# Patient Record
Sex: Female | Born: 1960 | Race: White | Hispanic: No | Marital: Married | Smoking: Former smoker
Health system: Southern US, Community
[De-identification: ages and names within clinical notes are randomized; demographics above are authoritative.]

## PROBLEM LIST (undated history)

## (undated) DIAGNOSIS — F329 Major depressive disorder, single episode, unspecified: Secondary | ICD-10-CM

## (undated) DIAGNOSIS — F419 Anxiety disorder, unspecified: Secondary | ICD-10-CM

## (undated) DIAGNOSIS — M810 Age-related osteoporosis without current pathological fracture: Secondary | ICD-10-CM

## (undated) DIAGNOSIS — E785 Hyperlipidemia, unspecified: Secondary | ICD-10-CM

## (undated) DIAGNOSIS — F32A Depression, unspecified: Secondary | ICD-10-CM

## (undated) DIAGNOSIS — I10 Essential (primary) hypertension: Secondary | ICD-10-CM

## (undated) DIAGNOSIS — K219 Gastro-esophageal reflux disease without esophagitis: Secondary | ICD-10-CM

## (undated) HISTORY — DX: Age-related osteoporosis without current pathological fracture: M81.0

## (undated) HISTORY — DX: Essential (primary) hypertension: I10

## (undated) HISTORY — DX: Major depressive disorder, single episode, unspecified: F32.9

## (undated) HISTORY — DX: Anxiety disorder, unspecified: F41.9

## (undated) HISTORY — DX: Hyperlipidemia, unspecified: E78.5

## (undated) HISTORY — DX: Depression, unspecified: F32.A

## (undated) HISTORY — DX: Gastro-esophageal reflux disease without esophagitis: K21.9

## (undated) HISTORY — PX: CHOLECYSTECTOMY: SHX55

---

## 2014-10-19 ENCOUNTER — Encounter: Payer: Self-pay | Admitting: *Deleted

## 2014-11-15 ENCOUNTER — Ambulatory Visit (INDEPENDENT_AMBULATORY_CARE_PROVIDER_SITE_OTHER): Payer: Medicare Other | Admitting: Physician Assistant

## 2014-11-15 ENCOUNTER — Encounter: Payer: Self-pay | Admitting: Physician Assistant

## 2014-11-15 VITALS — BP 114/66 | HR 76 | Temp 97.8°F | Resp 18 | Ht 60.5 in | Wt 180.0 lb

## 2014-11-15 DIAGNOSIS — M810 Age-related osteoporosis without current pathological fracture: Secondary | ICD-10-CM | POA: Insufficient documentation

## 2014-11-15 DIAGNOSIS — R32 Unspecified urinary incontinence: Secondary | ICD-10-CM | POA: Diagnosis not present

## 2014-11-15 DIAGNOSIS — F4310 Post-traumatic stress disorder, unspecified: Secondary | ICD-10-CM | POA: Insufficient documentation

## 2014-11-15 DIAGNOSIS — I10 Essential (primary) hypertension: Secondary | ICD-10-CM | POA: Diagnosis not present

## 2014-11-15 DIAGNOSIS — G43809 Other migraine, not intractable, without status migrainosus: Secondary | ICD-10-CM

## 2014-11-15 DIAGNOSIS — G43909 Migraine, unspecified, not intractable, without status migrainosus: Secondary | ICD-10-CM | POA: Insufficient documentation

## 2014-11-15 DIAGNOSIS — F411 Generalized anxiety disorder: Secondary | ICD-10-CM | POA: Diagnosis not present

## 2014-11-15 DIAGNOSIS — Z23 Encounter for immunization: Secondary | ICD-10-CM

## 2014-11-15 DIAGNOSIS — F329 Major depressive disorder, single episode, unspecified: Secondary | ICD-10-CM | POA: Diagnosis not present

## 2014-11-15 DIAGNOSIS — E785 Hyperlipidemia, unspecified: Secondary | ICD-10-CM | POA: Insufficient documentation

## 2014-11-15 DIAGNOSIS — K759 Inflammatory liver disease, unspecified: Secondary | ICD-10-CM | POA: Insufficient documentation

## 2014-11-15 DIAGNOSIS — F32A Depression, unspecified: Secondary | ICD-10-CM | POA: Insufficient documentation

## 2014-11-15 LAB — COMPLETE METABOLIC PANEL WITH GFR
ALT: 38 U/L — AB (ref 6–29)
AST: 24 U/L (ref 10–35)
Albumin: 4.2 g/dL (ref 3.6–5.1)
Alkaline Phosphatase: 156 U/L — ABNORMAL HIGH (ref 33–130)
BILIRUBIN TOTAL: 0.6 mg/dL (ref 0.2–1.2)
BUN: 11 mg/dL (ref 7–25)
CO2: 27 mmol/L (ref 20–31)
CREATININE: 0.6 mg/dL (ref 0.50–1.05)
Calcium: 9.6 mg/dL (ref 8.6–10.4)
Chloride: 106 mmol/L (ref 98–110)
GFR, Est African American: >89 mL/min (ref >=60)
GFR, Est Non African American: >89 mL/min (ref >=60)
GLUCOSE: 96 mg/dL (ref 70–99)
Potassium: 4.4 mmol/L (ref 3.5–5.3)
SODIUM: 138 mmol/L (ref 135–146)
TOTAL PROTEIN: 6.7 g/dL (ref 6.1–8.1)

## 2014-11-15 LAB — LIPID PANEL
Cholesterol: 235 mg/dL — ABNORMAL HIGH (ref 125–200)
HDL: 27 mg/dL — AB (ref >=46)
LDL CALC: 156 mg/dL — AB (ref <130)
Total CHOL/HDL Ratio: 8.7 Ratio — ABNORMAL HIGH (ref <=5.0)
Triglycerides: 259 mg/dL — ABNORMAL HIGH (ref <150)
VLDL: 52 mg/dL — ABNORMAL HIGH (ref <30)

## 2014-11-15 MED ORDER — SUMATRIPTAN SUCCINATE 100 MG PO TABS: 100.0000 mg | ORAL_TABLET | ORAL | Status: DC | PRN

## 2014-11-15 MED ORDER — CLONAZEPAM 1 MG PO TABS: 1.0000 mg | ORAL_TABLET | Freq: Two times a day (BID) | ORAL | Status: DC | PRN

## 2014-11-15 MED ORDER — CLONIDINE HCL 0.1 MG PO TABS: 0.1000 mg | ORAL_TABLET | Freq: Two times a day (BID) | ORAL | Status: DC
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
## 2014-11-16 ENCOUNTER — Other Ambulatory Visit: Payer: Self-pay | Admitting: Family Medicine

## 2014-11-16 MED ORDER — ATORVASTATIN CALCIUM 10 MG PO TABS: 10.0000 mg | ORAL_TABLET | Freq: Every day | ORAL | Status: DC

## 2014-11-16 MED ORDER — ALENDRONATE SODIUM 70 MG PO TABS: 70.0000 mg | ORAL_TABLET | ORAL | Status: DC

## 2014-11-16 MED ORDER — SOLIFENACIN SUCCINATE 5 MG PO TABS: 5.0000 mg | ORAL_TABLET | Freq: Every day | ORAL | Status: DC

## 2014-11-16 MED ORDER — OMEPRAZOLE 20 MG PO CPDR: 20.0000 mg | DELAYED_RELEASE_CAPSULE | Freq: Every day | ORAL | Status: DC

## 2014-11-16 MED ORDER — CITALOPRAM HYDROBROMIDE 40 MG PO TABS: 40.0000 mg | ORAL_TABLET | Freq: Every day | ORAL | Status: DC

## 2014-11-16 NOTE — Telephone Encounter (Signed)
Medication refilled per protocol. 

## 2014-12-07 ENCOUNTER — Encounter: Payer: Self-pay | Admitting: Physician Assistant

## 2014-12-07 ENCOUNTER — Ambulatory Visit (INDEPENDENT_AMBULATORY_CARE_PROVIDER_SITE_OTHER): Payer: Medicare Other | Admitting: Physician Assistant

## 2014-12-07 VITALS — BP 126/84 | HR 88 | Temp 98.6°F | Resp 18 | Wt 181.0 lb

## 2014-12-07 DIAGNOSIS — G43809 Other migraine, not intractable, without status migrainosus: Secondary | ICD-10-CM | POA: Diagnosis not present

## 2014-12-07 DIAGNOSIS — F329 Major depressive disorder, single episode, unspecified: Secondary | ICD-10-CM

## 2014-12-07 DIAGNOSIS — M810 Age-related osteoporosis without current pathological fracture: Secondary | ICD-10-CM | POA: Diagnosis not present

## 2014-12-07 DIAGNOSIS — R32 Unspecified urinary incontinence: Secondary | ICD-10-CM | POA: Diagnosis not present

## 2014-12-07 DIAGNOSIS — E785 Hyperlipidemia, unspecified: Secondary | ICD-10-CM

## 2014-12-07 DIAGNOSIS — K759 Inflammatory liver disease, unspecified: Secondary | ICD-10-CM

## 2014-12-07 DIAGNOSIS — F411 Generalized anxiety disorder: Secondary | ICD-10-CM | POA: Diagnosis not present

## 2014-12-07 DIAGNOSIS — F4310 Post-traumatic stress disorder, unspecified: Secondary | ICD-10-CM | POA: Diagnosis not present

## 2014-12-07 DIAGNOSIS — F32A Depression, unspecified: Secondary | ICD-10-CM

## 2014-12-07 DIAGNOSIS — I10 Essential (primary) hypertension: Secondary | ICD-10-CM | POA: Diagnosis not present
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
## 2014-12-19 ENCOUNTER — Other Ambulatory Visit: Payer: Self-pay | Admitting: Physician Assistant

## 2014-12-19 NOTE — Telephone Encounter (Signed)
Ok to refill??  Last office visit 12/07/2014.  Last refill 11/15/2014.

## 2014-12-20 NOTE — Telephone Encounter (Signed)
#  60+1 refill. Tell patient that this must last 2 full months. Will not fill early.

## 2014-12-20 NOTE — Telephone Encounter (Signed)
Medication refilled per protocol. 

## 2015-02-19 ENCOUNTER — Other Ambulatory Visit: Payer: Self-pay | Admitting: Physician Assistant

## 2015-02-20 NOTE — Telephone Encounter (Signed)
LRF 12/20/14 #60 + 1 (clonazepam)  LOV 12/07/14  OK refill?

## 2015-02-20 NOTE — Telephone Encounter (Signed)
Medication refilled per protocol. 

## 2015-02-20 NOTE — Telephone Encounter (Signed)
Approved for # 60 + 2--but make sure pt aware each must last full 30 days

## 2015-03-21 ENCOUNTER — Other Ambulatory Visit: Payer: Self-pay | Admitting: Physician Assistant

## 2015-03-21 NOTE — Telephone Encounter (Signed)
Medication refilled per protocol. 

## 2015-05-27 ENCOUNTER — Other Ambulatory Visit: Payer: Self-pay | Admitting: Physician Assistant

## 2015-05-28 NOTE — Telephone Encounter (Signed)
LRF Clonazepam 02/20/15 #60 + 2  LRF Imitrex 02/20/15 #10 + 2  LOV 12/07/14  Has appt next week  OK refill?

## 2015-05-28 NOTE — Telephone Encounter (Signed)
Refills called in 

## 2015-05-28 NOTE — Telephone Encounter (Signed)
Fill each for one fill but no additional refills--until has OV

## 2015-05-30 ENCOUNTER — Other Ambulatory Visit: Payer: Self-pay | Admitting: Physician Assistant

## 2015-05-30 NOTE — Telephone Encounter (Signed)
Refill appropriate and filled per protocol. 

## 2015-06-07 ENCOUNTER — Ambulatory Visit (INDEPENDENT_AMBULATORY_CARE_PROVIDER_SITE_OTHER): Payer: Medicare Other | Admitting: Physician Assistant

## 2015-06-07 ENCOUNTER — Encounter: Payer: Self-pay | Admitting: Physician Assistant

## 2015-06-07 VITALS — BP 112/72 | HR 80 | Temp 97.7°F | Resp 18 | Wt 177.0 lb

## 2015-06-07 DIAGNOSIS — F329 Major depressive disorder, single episode, unspecified: Secondary | ICD-10-CM

## 2015-06-07 DIAGNOSIS — I10 Essential (primary) hypertension: Secondary | ICD-10-CM | POA: Diagnosis not present

## 2015-06-07 DIAGNOSIS — F32A Depression, unspecified: Secondary | ICD-10-CM

## 2015-06-07 DIAGNOSIS — R55 Syncope and collapse: Secondary | ICD-10-CM | POA: Diagnosis not present

## 2015-06-07 DIAGNOSIS — K759 Inflammatory liver disease, unspecified: Secondary | ICD-10-CM

## 2015-06-07 DIAGNOSIS — R079 Chest pain, unspecified: Secondary | ICD-10-CM

## 2015-06-07 DIAGNOSIS — M810 Age-related osteoporosis without current pathological fracture: Secondary | ICD-10-CM | POA: Diagnosis not present

## 2015-06-07 DIAGNOSIS — R32 Unspecified urinary incontinence: Secondary | ICD-10-CM | POA: Diagnosis not present

## 2015-06-07 DIAGNOSIS — E785 Hyperlipidemia, unspecified: Secondary | ICD-10-CM

## 2015-06-07 DIAGNOSIS — F4310 Post-traumatic stress disorder, unspecified: Secondary | ICD-10-CM | POA: Diagnosis not present

## 2015-06-07 DIAGNOSIS — F411 Generalized anxiety disorder: Secondary | ICD-10-CM

## 2015-06-07 DIAGNOSIS — G43809 Other migraine, not intractable, without status migrainosus: Secondary | ICD-10-CM

## 2015-06-07 LAB — COMPLETE METABOLIC PANEL WITH GFR
ALBUMIN: 4 g/dL (ref 3.6–5.1)
ALK PHOS: 134 U/L — AB (ref 33–130)
ALT: 38 U/L — AB (ref 6–29)
AST: 26 U/L (ref 10–35)
BILIRUBIN TOTAL: 0.5 mg/dL (ref 0.2–1.2)
BUN: 15 mg/dL (ref 7–25)
CALCIUM: 9.6 mg/dL (ref 8.6–10.4)
CO2: 23 mmol/L (ref 20–31)
CREATININE: 0.74 mg/dL (ref 0.50–1.05)
Chloride: 105 mmol/L (ref 98–110)
GFR, Est Non African American: >89 mL/min (ref >=60)
Glucose, Bld: 89 mg/dL (ref 70–99)
Potassium: 4.7 mmol/L (ref 3.5–5.3)
Sodium: 138 mmol/L (ref 135–146)
Total Protein: 6.6 g/dL (ref 6.1–8.1)
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
## 2015-06-23 ENCOUNTER — Other Ambulatory Visit: Payer: Self-pay | Admitting: Physician Assistant

## 2015-06-25 NOTE — Telephone Encounter (Signed)
Klonopin # 60 + 2 Imitrex # 10 + 5

## 2015-06-25 NOTE — Telephone Encounter (Signed)
Medication refilled per protocol. 

## 2015-06-25 NOTE — Telephone Encounter (Signed)
Both refilled 05/28/15 x 1.  LOV 06/07/15  OK refill?

## 2015-06-27 NOTE — Progress Notes (Signed)
Cardiology Office Note   Date:  06/28/2015   ID:  Priscilla Molina, DOB February 23, 1960, MRN [id number]  PCP:  Frazier RichardsIXON,MARY BETH, PA-C  Cardiologist:   Chilton Siiffany Iberia, MD   Chief Complaint  Patient presents with  . New Evaluation    Referred by Allayne ButcherMary Dixon, PA-C for Chest pain  last EKG 06/07/15  pt c/o sharp chest pain--happened 3-4 times last month--one time was very bad and seemed different than chest pain from anxiety; occasional SOB when shes having trapped gas--caused syncope one time; occasional dizziness when she stands up; swelling in hands--has not noticed feet     History of Present Illness: Priscilla Molina is a 55 y.o. female with hypertension, hyperlipidemia and anxiety who presents for who presents for an evaluation of chest pain.  Priscilla Molina saw her PCP, Allayne ButcherMary Dixon, PA-C, on 06/07/15.  At that appointment she reported intermittent episodes of chest pain.  EKG was negative for ischemia.  She was referred to cardiology for further evaluation.  She reports intermittent episodes of substernal chest discomfort. The episode in April was most severe but she continues to have intermittent episodes discomfort lasts for several minutes at a time and is associated with shortness of breath. In April she had severe abdominal discomfort, nausea and diaphoresis.  She tried to call her mom but had an episode of syncope. She is unclear of how long she was out. Since then she denies any recurrent syncope, nausea, or diaphoresis with the discomfort. It does not change with exertion. The episode in April was 8 out of 10 in severity. Recently it has only been 4-5 out of 10 in severity. She has not noted any recent lightheadedness, dizziness, or palpitations.  Priscilla Molina denies lower extremity edema, orthopnea or PND. She has noted some mild swelling in bilateral hands. She wonders if these episodes could be associated with her anxiety, which she states is not well-controlled.  Priscilla Molina continues to smoke one  pack of cigarettes daily. She has not been able to quit successfully in the past. She asked her PCP about trying Chantix but was told this was not a good medication for her.   Past Medical History  Diagnosis Date  . Anxiety   . Depression   . GERD (gastroesophageal reflux disease)   . Hypertension   . Hyperlipidemia   . Osteoporosis     Past Surgical History  Procedure Laterality Date  . Cholecystectomy       Current Outpatient Prescriptions  Medication Sig Dispense Refill  . albuterol (PROVENTIL HFA;VENTOLIN HFA) 108 (90 BASE) MCG/ACT inhaler Inhale 2 puffs into the lungs every 6 (six) hours as needed for wheezing or shortness of breath.    Marland Kitchen. alendronate (FOSAMAX) 70 MG tablet TAKE 1 TABLET BY MOUTH ONCE A WEEK WITH A FULL GLASS OF WATER ON EMPTY STOMACH 12 tablet 1  . atorvastatin (LIPITOR) 10 MG tablet TAKE 1 TABLET (10 MG TOTAL) BY MOUTH DAILY AT 6 PM. 90 tablet 1  . citalopram (CELEXA) 40 MG tablet TAKE 1 TABLET EVERY DAY 90 tablet 1  . clonazePAM (KLONOPIN) 1 MG tablet TAKE 1 TABLET TWICE A DAY AS NEEDED 60 tablet 2  . cloNIDine (CATAPRES) 0.1 MG tablet TAKE 1 TABLET TWICE A DAY 60 tablet 5  . meloxicam (MOBIC) 15 MG tablet Take 15 mg by mouth daily.    Marland Kitchen. omeprazole (PRILOSEC) 20 MG capsule TAKE 1 CAPSULE EVERY DAY 90 capsule 1  . SUMAtriptan (IMITREX) 100 MG tablet TAKE 1  TABLET AS NEEDED FOR MIGRAINE. MAY REPEAT 1 TIME IN 2 HOURS 10 tablet 5  . VESICARE 5 MG tablet TAKE 1 TABLET EVERY DAY 90 tablet 1   No current facility-administered medications for this visit.    Allergies:   Codeine    Social History:  The patient  reports that she has been smoking Cigarettes.  She has been smoking about 0.50 packs per day. She has never used smokeless tobacco. She reports that she does not drink alcohol or use illicit drugs.   Family History:  The patient's family history includes Diabetes in her sister; Heart disease in her mother; Hyperlipidemia in her sister; Hypertension in her  mother and sister; Stroke in her brother and sister. She was adopted.    ROS:  Please see the history of present illness.   Otherwise, review of systems are positive for none.   All other systems are reviewed and negative.    PHYSICAL EXAM: VS:  BP 134/84 mmHg  Pulse 95  Ht 5' (1.524 m)  Wt 81.647 kg (180 lb)  BMI 35.15 kg/m2 , BMI Body mass index is 35.15 kg/(m^2). GENERAL:  Well appearing HEENT:  Pupils equal round and reactive, fundi not visualized, oral mucosa unremarkable NECK:  No jugular venous distention, waveform within normal limits, carotid upstroke brisk and symmetric, no bruits, no thyromegaly LYMPHATICS:  No cervical adenopathy LUNGS:  Clear to auscultation bilaterally HEART:  RRR.  PMI not displaced or sustained,S1 and S2 within normal limits, no S3, no S4, no clicks, no rubs, no murmurs ABD:  Flat, positive bowel sounds normal in frequency in pitch, no bruits, no rebound, no guarding, no midline pulsatile mass, no hepatomegaly, no splenomegaly EXT:  2 plus pulses throughout, no edema, no cyanosis no clubbing SKIN:  No rashes no nodules NEURO:  Cranial nerves II through XII grossly intact, motor grossly intact throughout PSYCH:  Cognitively intact, oriented to person place and time   EKG:  EKG is not ordered today. The ekg ordered 06/07/15 demonstrates sinus rhythm rate 77 bpm. Anterior T-wave inversions.  Recent Labs: 06/07/2015: ALT 38*; BUN 15; Creat 0.74; Potassium 4.7; Sodium 138    Lipid Panel    Component Value Date/Time   CHOL 235* 11/15/2014 1244   TRIG 259* 11/15/2014 1244   HDL 27* 11/15/2014 1244   CHOLHDL 8.7* 11/15/2014 1244   VLDL 52* 11/15/2014 1244   LDLCALC 156* 11/15/2014 1244      Wt Readings from Last 3 Encounters:  06/28/15 81.647 kg (180 lb)  06/07/15 80.287 kg (177 lb)  12/07/14 82.101 kg (181 lb)      ASSESSMENT AND PLAN:  # Atypical chest pain: Priscilla Molina has atypical chest pain. We will obtain an exercise Cardiolite to  evaluate for ischemia. She has anterior T-wave inversions which will lower the specificity of changes on a plain treadmill stress test.  # Hyperlipidemia: Priscilla Molina had elevated cholesterol levels in September 2016.  She does not think that her atorvastatin was adjusted at that time. We will increase the dose to 20 mg daily and asked her to check lipids and LFTs in 6 weeks.   # Syncope: Priscilla Molina had an episode of syncope in the setting of abdominal pain, nausea, and diaphoresis. It is likely that this was a vasovagal reaction. However, we are obtaining an exercise Cardiolite to rule out ischemia as above.   # Tobacco abuse: Priscilla Molina was encouraged to try and stop smoking. She is very interested in doing so  at this time. We recommended nicotine patches, given that she has poorly controlled anxiety and depression. I do not think that Chantix or Wellbutrin will be good medicines for her. She will start 21 mg patches daily for 6 weeks followed by 40 mg for 2 weeks followed by 7 mg for 2 weeks.   # Hypertension: Priscilla Molina's BP is well-controlled on clonidine.   it is unclear to me why she is on this as a singular blood pressure control agent. It can cause rebound hypertension and many other side effects. Ideally she would be on an alternative antihypertensive. Given that her blood pressure is well-controlled we will not make any changes at this time. Consider switching to an alternative agent in the future, especially if she develops symptoms.    Current medicines are reviewed at length with the patient today.  The patient does not have concerns regarding medicines.  The following changes have been made:  Increase atorvastatin to 20 mg. Start nicotine patches.   Labs/ tests ordered today include:  No orders of the defined types were placed in this encounter.     Disposition:   FU with Madline Oesterling C. Duke Salvia, MD, [facility] in 6 months    This note was written with the assistance of speech recognition  software.  Please excuse any transcriptional errors.  Signed, Koleman Marling C. Duke Salvia, MD, [facility]  06/28/2015 11:56 AM    [facility]

## 2015-06-28 ENCOUNTER — Encounter: Payer: Self-pay | Admitting: Cardiovascular Disease

## 2015-06-28 ENCOUNTER — Ambulatory Visit (INDEPENDENT_AMBULATORY_CARE_PROVIDER_SITE_OTHER): Payer: Medicare Other | Admitting: Cardiovascular Disease

## 2015-06-28 VITALS — BP 134/84 | HR 95 | Ht 60.0 in | Wt 180.0 lb

## 2015-06-28 DIAGNOSIS — I10 Essential (primary) hypertension: Secondary | ICD-10-CM

## 2015-06-28 DIAGNOSIS — R079 Chest pain, unspecified: Secondary | ICD-10-CM | POA: Diagnosis not present

## 2015-06-28 DIAGNOSIS — E785 Hyperlipidemia, unspecified: Secondary | ICD-10-CM

## 2015-06-28 DIAGNOSIS — R55 Syncope and collapse: Secondary | ICD-10-CM

## 2015-06-28 DIAGNOSIS — Z720 Tobacco use: Secondary | ICD-10-CM | POA: Diagnosis not present

## 2015-06-28 MED ORDER — ATORVASTATIN CALCIUM 20 MG PO TABS: 20.0000 mg | ORAL_TABLET | Freq: Every day | ORAL | Status: DC

## 2015-06-28 MED ORDER — NICOTINE 21 MG/24HR TD PT24: MEDICATED_PATCH | TRANSDERMAL | Status: DC

## 2015-06-28 MED ORDER — NICOTINE 7 MG/24HR TD PT24: MEDICATED_PATCH | TRANSDERMAL | Status: DC

## 2015-06-28 MED ORDER — NICOTINE 14 MG/24HR TD PT24: MEDICATED_PATCH | TRANSDERMAL | Status: DC

## 2015-06-28 NOTE — Addendum Note (Signed)
Addended by: Regis BillPRATT, Shaden Higley B on: 06/28/2015 01:41 PM   Modules accepted: Orders

## 2015-06-28 NOTE — Patient Instructions (Addendum)
Medication Instructions:  START THE NICODERM PATCH 21 MG APPLY TO SKIN DAILY FOR 6 WEEKS, THEN DECREASE TO 14 MG DAILY FOR 2 WEEKS, AND THEN 7 MG DAILY FOR 2 WEEKS   INCREASE YOUR LIPITOR (ATORVASTATIN) TO 20 MG DAILY  Labwork: LP/HFP AT [facility] ON FIRST FLOOR AROUND 08/09/15  Testing/Procedures: Your physician has requested that you have a lexiscan myoview. For further information please visit [email or web address]. Please follow instruction sheet, as given.  Follow-Up: Your physician wants you to follow-up in: 6 month ov You will receive a reminder letter in the mail two months in advance. If you don't receive a letter, please call our office to schedule the follow-up appointment.  If you need a refill on your cardiac medications before your next appointment, please call your pharmacy.

## 2015-07-12 ENCOUNTER — Telehealth (HOSPITAL_COMMUNITY): Payer: Self-pay

## 2015-07-12 NOTE — Telephone Encounter (Signed)
Encounter complete. 

## 2015-07-17 ENCOUNTER — Ambulatory Visit (HOSPITAL_COMMUNITY)
Admission: RE | Admit: 2015-07-17 | Discharge: 2015-07-17 | Disposition: A | Discharge status: Home / Self Care | Payer: Medicare Other | Source: Ambulatory Visit | Attending: Cardiology | Admitting: Cardiology

## 2015-07-17 DIAGNOSIS — Z6835 Body mass index (BMI) 35.0-35.9, adult: Secondary | ICD-10-CM | POA: Insufficient documentation

## 2015-07-17 DIAGNOSIS — R002 Palpitations: Secondary | ICD-10-CM | POA: Diagnosis not present

## 2015-07-17 DIAGNOSIS — I10 Essential (primary) hypertension: Secondary | ICD-10-CM | POA: Insufficient documentation

## 2015-07-17 DIAGNOSIS — R42 Dizziness and giddiness: Secondary | ICD-10-CM | POA: Diagnosis not present

## 2015-07-17 DIAGNOSIS — R0609 Other forms of dyspnea: Secondary | ICD-10-CM | POA: Diagnosis not present

## 2015-07-17 DIAGNOSIS — E669 Obesity, unspecified: Secondary | ICD-10-CM | POA: Diagnosis not present

## 2015-07-17 DIAGNOSIS — R0602 Shortness of breath: Secondary | ICD-10-CM | POA: Diagnosis not present

## 2015-07-17 DIAGNOSIS — Z8249 Family history of ischemic heart disease and other diseases of the circulatory system: Secondary | ICD-10-CM | POA: Insufficient documentation

## 2015-07-17 DIAGNOSIS — Z720 Tobacco use: Secondary | ICD-10-CM | POA: Diagnosis not present

## 2015-07-17 DIAGNOSIS — R55 Syncope and collapse: Secondary | ICD-10-CM | POA: Insufficient documentation

## 2015-07-17 DIAGNOSIS — R5383 Other fatigue: Secondary | ICD-10-CM | POA: Diagnosis not present

## 2015-07-17 DIAGNOSIS — R079 Chest pain, unspecified: Secondary | ICD-10-CM | POA: Diagnosis not present

## 2015-07-17 LAB — MYOCARDIAL PERFUSION IMAGING
CHL CUP NUCLEAR SDS: 2
CHL CUP NUCLEAR SSS: 3
CSEPPHR: 93 {beats}/min
LV dias vol: 44 mL (ref 46–106)
LV sys vol: 14 mL
Rest HR: 77 {beats}/min
SRS: 1
TID: 1.61

## 2015-07-17 MED ORDER — AMINOPHYLLINE 25 MG/ML IV SOLN
75.0000 mg | Freq: Once | INTRAVENOUS | Status: AC
  Administered 2015-07-17: 75 mg via INTRAVENOUS

## 2015-07-17 MED ORDER — TECHNETIUM TC 99M TETROFOSMIN IV KIT
30.2000 | PACK | Freq: Once | INTRAVENOUS | Status: AC | PRN
  Administered 2015-07-17: 30.2 via INTRAVENOUS
  Filled 2015-07-17: qty 30

## 2015-07-17 MED ORDER — REGADENOSON 0.4 MG/5ML IV SOLN
0.4000 mg | Freq: Once | INTRAVENOUS | Status: AC
  Administered 2015-07-17: 0.4 mg via INTRAVENOUS

## 2015-07-17 MED ORDER — TECHNETIUM TC 99M TETROFOSMIN IV KIT
10.7000 | PACK | Freq: Once | INTRAVENOUS | Status: AC | PRN
  Administered 2015-07-17: 11 via INTRAVENOUS
  Filled 2015-07-17: qty 11

## 2015-08-08 ENCOUNTER — Ambulatory Visit (INDEPENDENT_AMBULATORY_CARE_PROVIDER_SITE_OTHER): Payer: Medicare Other | Admitting: Physician Assistant

## 2015-08-08 ENCOUNTER — Encounter: Payer: Self-pay | Admitting: Physician Assistant

## 2015-08-08 VITALS — BP 120/88 | HR 89 | Temp 97.7°F | Resp 16 | Ht 60.0 in | Wt 184.0 lb

## 2015-08-08 DIAGNOSIS — K5900 Constipation, unspecified: Secondary | ICD-10-CM | POA: Diagnosis not present

## 2015-08-08 DIAGNOSIS — K5909 Other constipation: Secondary | ICD-10-CM | POA: Insufficient documentation

## 2015-08-08 MED ORDER — LINACLOTIDE 290 MCG PO CAPS: 290.0000 ug | ORAL_CAPSULE | Freq: Every day | ORAL | Status: DC

## 2015-08-08 NOTE — Progress Notes (Signed)
Patient ID: Priscilla Molina MRN: [id number], DOB: 02-13-1961, 55 y.o. Date of Encounter: @DATE @  Chief Complaint:  Chief Complaint  Patient presents with  . OTHER    abdominal pain into left side, constipation    HPI: 55 y.o. year old white female  presents with above.  Says that over the weekend she had some low-grade fever. No fever past 3 days.   Says she had some left lower quadrant pain over the weekend. That has improved.   Says she has chronic problem with constipation. Uses Dulcolax on daily basis. Says constipation got worse recently --had to take 4 Dulcolax to finally have BM.  Has been passing stool- but having to take increasing amount of Dulcolax.    Past Medical History  Diagnosis Date  . Anxiety   . Depression   . GERD (gastroesophageal reflux disease)   . Hypertension   . Hyperlipidemia   . Osteoporosis      Home Meds: Outpatient Prescriptions Prior to Visit  Medication Sig Dispense Refill  . albuterol (PROVENTIL HFA;VENTOLIN HFA) 108 (90 BASE) MCG/ACT inhaler Inhale 2 puffs into the lungs every 6 (six) hours as needed for wheezing or shortness of breath.    Marland Kitchen. alendronate (FOSAMAX) 70 MG tablet TAKE 1 TABLET BY MOUTH ONCE A WEEK WITH A FULL GLASS OF WATER ON EMPTY STOMACH 12 tablet 1  . atorvastatin (LIPITOR) 20 MG tablet Take 1 tablet (20 mg total) by mouth daily. 30 tablet 5  . citalopram (CELEXA) 40 MG tablet TAKE 1 TABLET EVERY DAY 90 tablet 1  . clonazePAM (KLONOPIN) 1 MG tablet TAKE 1 TABLET TWICE A DAY AS NEEDED 60 tablet 2  . cloNIDine (CATAPRES) 0.1 MG tablet TAKE 1 TABLET TWICE A DAY 60 tablet 5  . omeprazole (PRILOSEC) 20 MG capsule TAKE 1 CAPSULE EVERY DAY 90 capsule 1  . SUMAtriptan (IMITREX) 100 MG tablet TAKE 1 TABLET AS NEEDED FOR MIGRAINE. MAY REPEAT 1 TIME IN 2 HOURS 10 tablet 5  . VESICARE 5 MG tablet TAKE 1 TABLET EVERY DAY 90 tablet 1  . meloxicam (MOBIC) 15 MG tablet Take 15 mg by mouth daily. Reported on 08/08/2015    . nicotine  (NICODERM CQ - DOSED IN MG/24 HOURS) 21 mg/24hr patch Apply to skin daily for 6 weeks (Patient not taking: Reported on 08/08/2015) 28 patch 1  . nicotine (NICODERM CQ) 14 mg/24hr patch APPLY TO SKIN DAILY FOR 2 WEEKS AFTER COMPLETING THE 21 MG (Patient not taking: Reported on 08/08/2015) 14 patch 0  . nicotine (NICODERM CQ) 7 mg/24hr patch APPLY TO SKIN DAILY FOR 2 WEEKS AFTER COMPLETING THE 14 MG (Patient not taking: Reported on 08/08/2015) 14 patch 0   No facility-administered medications prior to visit.    Allergies:  Allergies  Allergen Reactions  . Codeine Rash    Social History   Social History  . Marital Status: Married    Spouse Name: N/A  . Number of Children: N/A  . Years of Education: N/A   Occupational History  . Not on file.   Social History Main Topics  . Smoking status: Current Every Day Smoker -- 0.50 packs/day    Types: Cigarettes  . Smokeless tobacco: Never Used  . Alcohol Use: No  . Drug Use: No  . Sexual Activity: Not Currently   Other Topics Concern  . Not on file   Social History Narrative    Family History  Problem Relation Age of Onset  . Adopted: Yes  .  Heart disease Mother   . Hypertension Mother   . Stroke Sister   . Hypertension Sister   . Stroke Brother   . Diabetes Sister   . Hyperlipidemia Sister      Review of Systems:  See HPI for pertinent ROS. All other ROS negative.    Physical Exam: Blood pressure 120/88, pulse 89, temperature 97.7 F (36.5 C), temperature source Oral, resp. rate 16, height 5' (1.524 m), weight 184 lb (83.462 kg), SpO2 97 %., Body mass index is 35.94 kg/(m^2). General: WNWD WF. Appears in no acute distress. Neck: Supple. No thyromegaly. No lymphadenopathy. Lungs: Clear bilaterally to auscultation without wheezes, rales, or rhonchi. Breathing is unlabored. Heart: RRR with S1 S2. No murmurs, rubs, or gallops. Abdomen: Soft,  non-distended with normoactive bowel sounds. No hepatomegaly. No rebound/guarding. No  obvious abdominal masses.Minimal tenderness with palpation left lower quadrant. No other area of tenderness.  Musculoskeletal:  Strength and tone normal for age. Extremities/Skin: Warm and dry.  Neuro: Alert and oriented X 3. Moves all extremities spontaneously. Gait is normal. CNII-XII grossly in tact. Psych:  Responds to questions appropriately with a normal affect.     ASSESSMENT AND PLAN:  55 y.o. year old female with  1. Chronic constipation Gave her 1 bottle of samples--4 pills- to take 1 Q AM.  Gave savings card and RX.  Told her if cost is an issue with her insurance, let me know (would change to Amitiza BID) - linaclotide (LINZESS) 290 MCG CAPS capsule; Take 1 capsule (290 mcg total) by mouth daily before breakfast.  Dispense: 90 capsule; Refill: 1   Signed, [address] [place], [place], [facility] 08/08/2015 7:07 PM

## 2015-08-09 ENCOUNTER — Encounter: Payer: Self-pay | Admitting: Physician Assistant

## 2015-08-09 ENCOUNTER — Ambulatory Visit (INDEPENDENT_AMBULATORY_CARE_PROVIDER_SITE_OTHER): Payer: Medicare Other | Admitting: Physician Assistant

## 2015-08-09 VITALS — BP 110/80 | HR 76 | Temp 98.1°F | Ht 60.0 in | Wt 182.0 lb

## 2015-08-09 DIAGNOSIS — R109 Unspecified abdominal pain: Secondary | ICD-10-CM | POA: Diagnosis not present

## 2015-08-09 DIAGNOSIS — R300 Dysuria: Secondary | ICD-10-CM

## 2015-08-09 LAB — URINALYSIS, MICROSCOPIC ONLY
Bacteria, UA: NONE SEEN [HPF]
Casts: NONE SEEN [LPF]
Crystals: NONE SEEN [HPF]
WBC, UA: NONE SEEN WBC/HPF (ref <=5)
Yeast: NONE SEEN [HPF]

## 2015-08-09 LAB — URINALYSIS, ROUTINE W REFLEX MICROSCOPIC
Bilirubin Urine: NEGATIVE
Glucose, UA: NEGATIVE
KETONES UR: NEGATIVE
Leukocytes, UA: NEGATIVE
NITRITE: NEGATIVE
Protein, ur: NEGATIVE
Specific Gravity, Urine: 1.01 (ref 1.001–1.035)
pH: 5.5 (ref 5.0–8.0)

## 2015-08-09 NOTE — Progress Notes (Signed)
Patient ID: Terie PurserDonna Looney MRN: [id number], DOB: Aug 08, 1960, 55 y.o. Date of Encounter: 08/09/2015, 2:20 PM    Chief Complaint:  Chief Complaint  Patient presents with  . OTHER    Dysuria and pain/pressure      HPI: 55 y.o. year old white female says she is feeling a "pressure" in low abdomen that seems to get better when she urinates. Therefore thought may be secondary to UTI.  Having no burning with urination. No discomfort with urination at all.  No itching.  No fever/chills.      Home Meds:   Outpatient Prescriptions Prior to Visit  Medication Sig Dispense Refill  . albuterol (PROVENTIL HFA;VENTOLIN HFA) 108 (90 BASE) MCG/ACT inhaler Inhale 2 puffs into the lungs every 6 (six) hours as needed for wheezing or shortness of breath.    Marland Kitchen. alendronate (FOSAMAX) 70 MG tablet TAKE 1 TABLET BY MOUTH ONCE A WEEK WITH A FULL GLASS OF WATER ON EMPTY STOMACH 12 tablet 1  . atorvastatin (LIPITOR) 20 MG tablet Take 1 tablet (20 mg total) by mouth daily. 30 tablet 5  . citalopram (CELEXA) 40 MG tablet TAKE 1 TABLET EVERY DAY 90 tablet 1  . clonazePAM (KLONOPIN) 1 MG tablet TAKE 1 TABLET TWICE A DAY AS NEEDED 60 tablet 2  . cloNIDine (CATAPRES) 0.1 MG tablet TAKE 1 TABLET TWICE A DAY 60 tablet 5  . linaclotide (LINZESS) 290 MCG CAPS capsule Take 1 capsule (290 mcg total) by mouth daily before breakfast. 90 capsule 1  . meloxicam (MOBIC) 15 MG tablet Take 15 mg by mouth daily. Reported on 08/08/2015    . omeprazole (PRILOSEC) 20 MG capsule TAKE 1 CAPSULE EVERY DAY 90 capsule 1  . SUMAtriptan (IMITREX) 100 MG tablet TAKE 1 TABLET AS NEEDED FOR MIGRAINE. MAY REPEAT 1 TIME IN 2 HOURS 10 tablet 5  . VESICARE 5 MG tablet TAKE 1 TABLET EVERY DAY 90 tablet 1  . nicotine (NICODERM CQ - DOSED IN MG/24 HOURS) 21 mg/24hr patch Apply to skin daily for 6 weeks (Patient not taking: Reported on 08/08/2015) 28 patch 1  . nicotine (NICODERM CQ) 14 mg/24hr patch APPLY TO SKIN DAILY FOR 2 WEEKS AFTER COMPLETING  THE 21 MG (Patient not taking: Reported on 08/08/2015) 14 patch 0  . nicotine (NICODERM CQ) 7 mg/24hr patch APPLY TO SKIN DAILY FOR 2 WEEKS AFTER COMPLETING THE 14 MG (Patient not taking: Reported on 08/08/2015) 14 patch 0   No facility-administered medications prior to visit.    Allergies:  Allergies  Allergen Reactions  . Codeine Rash      Review of Systems: See HPI for pertinent ROS. All other ROS negative.    Physical Exam: Blood pressure 110/80, pulse 76, temperature 98.1 F (36.7 C), temperature source Oral, height 5' (1.524 m), weight 182 lb (82.555 kg)., Body mass index is 35.54 kg/(m^2). General: WF.  Appears in no acute distress. Neck: Supple. No thyromegaly. No lymphadenopathy. Lungs: Clear bilaterally to auscultation without wheezes, rales, or rhonchi. Breathing is unlabored. Heart: Regular rhythm. No murmurs, rubs, or gallops. Abdomen: Soft, non-tender, non-distended with normoactive bowel sounds. No hepatomegaly. No rebound/guarding. No obvious abdominal masses. Msk:  Strength and tone normal for age. Extremities/Skin: Warm and dry.  Neuro: Alert and oriented X 3. Moves all extremities spontaneously. Gait is normal. CNII-XII grossly in tact. Psych:  Responds to questions appropriately with a normal affect.     ASSESSMENT AND PLAN:  55 y.o. year old female with  1. Dysuria - Urinalysis,  Routine w reflex microscopic [facility]  2. Abdominal pressure  Reviewed with her that UA shows no sign of infxn.  Also reviewed that she just had OV sec to constipation. She says the Linzess is working and she had BM this morning.  However, says she had days of not having BM so sure all of stool not out.  Suspect that the pressure she is feeling is stool in her colon. When she urinates and bladder empty, less pressure in abdomen secondary to colon full of feces.  Cont Linzess.  F/U if symptoms change or worsen.   [address], [place], [place], [facility] 08/09/2015 2:20  PM

## 2015-09-03 ENCOUNTER — Encounter: Payer: Self-pay | Admitting: Physician Assistant

## 2015-09-03 ENCOUNTER — Ambulatory Visit (INDEPENDENT_AMBULATORY_CARE_PROVIDER_SITE_OTHER): Payer: Medicare Other | Admitting: Physician Assistant

## 2015-09-03 VITALS — BP 114/80 | Temp 98.0°F | Wt 183.0 lb

## 2015-09-03 DIAGNOSIS — F17200 Nicotine dependence, unspecified, uncomplicated: Secondary | ICD-10-CM | POA: Insufficient documentation

## 2015-09-03 DIAGNOSIS — Z720 Tobacco use: Secondary | ICD-10-CM

## 2015-09-03 DIAGNOSIS — B9689 Other specified bacterial agents as the cause of diseases classified elsewhere: Principal | ICD-10-CM

## 2015-09-03 DIAGNOSIS — J988 Other specified respiratory disorders: Secondary | ICD-10-CM

## 2015-09-03 DIAGNOSIS — J441 Chronic obstructive pulmonary disease with (acute) exacerbation: Secondary | ICD-10-CM

## 2015-09-03 MED ORDER — AZITHROMYCIN 250 MG PO TABS: ORAL_TABLET | ORAL | Status: DC

## 2015-09-03 MED ORDER — PREDNISONE 20 MG PO TABS: ORAL_TABLET | ORAL | Status: DC

## 2015-09-03 NOTE — Progress Notes (Signed)
Patient ID: Priscilla Molina MRN: [id number], DOB: 05/28/1960, 55 y.o. Date of Encounter: 09/03/2015, 12:55 PM    Chief Complaint: Cough    HPI: 55 y.o. year old female presents with c/o cough, chest congestion.  Says the symptoms started Thursday 08/30/2015. Says that at that time she was having sore throat and her head was stuffy. Since then the sore throat eased off but she is still stuffy in the head. Says that yesterday she developed significant chest congestion and coughing up green phlegm. Also developed fever yesterday. Has been using her albuterol inhaler.     Home Meds:   Outpatient Prescriptions Prior to Visit  Medication Sig Dispense Refill  . albuterol (PROVENTIL HFA;VENTOLIN HFA) 108 (90 BASE) MCG/ACT inhaler Inhale 2 puffs into the lungs every 6 (six) hours as needed for wheezing or shortness of breath.    Marland Kitchen alendronate (FOSAMAX) 70 MG tablet TAKE 1 TABLET BY MOUTH ONCE A WEEK WITH A FULL GLASS OF WATER ON EMPTY STOMACH 12 tablet 1  . atorvastatin (LIPITOR) 20 MG tablet Take 1 tablet (20 mg total) by mouth daily. 30 tablet 5  . citalopram (CELEXA) 40 MG tablet TAKE 1 TABLET EVERY DAY 90 tablet 1  . clonazePAM (KLONOPIN) 1 MG tablet TAKE 1 TABLET TWICE A DAY AS NEEDED 60 tablet 2  . cloNIDine (CATAPRES) 0.1 MG tablet TAKE 1 TABLET TWICE A DAY 60 tablet 5  . linaclotide (LINZESS) 290 MCG CAPS capsule Take 1 capsule (290 mcg total) by mouth daily before breakfast. 90 capsule 1  . meloxicam (MOBIC) 15 MG tablet Take 15 mg by mouth daily. Reported on 08/08/2015    . omeprazole (PRILOSEC) 20 MG capsule TAKE 1 CAPSULE EVERY DAY 90 capsule 1  . SUMAtriptan (IMITREX) 100 MG tablet TAKE 1 TABLET AS NEEDED FOR MIGRAINE. MAY REPEAT 1 TIME IN 2 HOURS 10 tablet 5  . VESICARE 5 MG tablet TAKE 1 TABLET EVERY DAY 90 tablet 1  . nicotine (NICODERM CQ - DOSED IN MG/24 HOURS) 21 mg/24hr patch Apply to skin daily for 6 weeks (Patient not taking: Reported on 08/08/2015) 28 patch 1  . nicotine  (NICODERM CQ) 14 mg/24hr patch APPLY TO SKIN DAILY FOR 2 WEEKS AFTER COMPLETING THE 21 MG (Patient not taking: Reported on 08/08/2015) 14 patch 0  . nicotine (NICODERM CQ) 7 mg/24hr patch APPLY TO SKIN DAILY FOR 2 WEEKS AFTER COMPLETING THE 14 MG (Patient not taking: Reported on 08/08/2015) 14 patch 0   No facility-administered medications prior to visit.    Allergies:  Allergies  Allergen Reactions  . Codeine Rash      Review of Systems: See HPI for pertinent ROS. All other ROS negative.    Physical Exam: Blood pressure 114/80, temperature 98 F (36.7 C), weight 183 lb (83.008 kg)., Body mass index is 35.74 kg/(m^2). SaO2 on RA-- 97% General:  WNWD WF. Appears in no acute distress. HEENT: Normocephalic, atraumatic, eyes without discharge, sclera non-icteric, nares are without discharge. Bilateral auditory canals clear, TM's are without perforation, pearly grey and translucent with reflective cone of light bilaterally. Oral cavity moist, posterior pharynx without exudate, erythema, peritonsillar abscess.  Neck: Supple. No thyromegaly. No lymphadenopathy. Lungs:  She has wheezing throughout bilaterally diffusely. However there is air movement. Oxygen saturation on room air 97%. Heart: Regular rhythm. No murmurs, rubs, or gallops. Msk:  Strength and tone normal for age. Extremities/Skin: Warm and dry.  Neuro: Alert and oriented X 3. Moves all extremities spontaneously. Gait is normal. CNII-XII  grossly in tact. Psych:  Responds to questions appropriately with a normal affect.     ASSESSMENT AND PLAN:  55 y.o. year old female with  1. Bacterial respiratory infection - azithromycin (ZITHROMAX) 250 MG tablet; Day 1: Take 2 daily. Days 2-5: Take 1 daily.  Dispense: 6 tablet; Refill: 0  2. COPD exacerbation (HCC) - predniSONE (DELTASONE) 20 MG tablet; Take 2 daily for 2 days then 1 daily for 4 days  Dispense: 8 tablet; Refill: 0  3. Smoker  She is to take the azithromycin and the  prednisone as directed. She is to use her albuterol 2 puffs every 4 hours as needed. She is to follow-up if symptoms worsen or do not resolve and return back to baseline in one week.   [address], [place], [place], [facility] 09/03/2015 12:55 PM

## 2015-09-06 ENCOUNTER — Ambulatory Visit: Payer: Medicare Other | Admitting: Physician Assistant

## 2015-09-10 ENCOUNTER — Encounter: Payer: Self-pay | Admitting: Physician Assistant

## 2015-09-10 ENCOUNTER — Other Ambulatory Visit: Payer: Self-pay | Admitting: Family Medicine

## 2015-09-10 ENCOUNTER — Ambulatory Visit (INDEPENDENT_AMBULATORY_CARE_PROVIDER_SITE_OTHER): Payer: Medicare Other | Admitting: Physician Assistant

## 2015-09-10 VITALS — BP 130/76 | HR 92 | Temp 98.3°F | Resp 18 | Wt 183.0 lb

## 2015-09-10 DIAGNOSIS — J441 Chronic obstructive pulmonary disease with (acute) exacerbation: Secondary | ICD-10-CM

## 2015-09-10 MED ORDER — METHYLPREDNISOLONE ACETATE 80 MG/ML IJ SUSP
80.0000 mg | Freq: Once | INTRAMUSCULAR | Status: AC
  Administered 2015-09-10: 80 mg via INTRAMUSCULAR

## 2015-09-10 MED ORDER — IPRATROPIUM-ALBUTEROL 0.5-2.5 (3) MG/3ML IN SOLN
3.0000 mL | Freq: Once | RESPIRATORY_TRACT | Status: AC
  Administered 2015-09-10: 3 mL via RESPIRATORY_TRACT

## 2015-09-10 MED ORDER — LEVOFLOXACIN 750 MG PO TABS
750.0000 mg | ORAL_TABLET | Freq: Every day | ORAL | 0 refills | Status: DC
Start: 2015-09-10 — End: 2015-09-17

## 2015-09-10 MED ORDER — ALBUTEROL SULFATE HFA 108 (90 BASE) MCG/ACT IN AERS: 2.0000 | INHALATION_SPRAY | Freq: Four times a day (QID) | RESPIRATORY_TRACT | 3 refills | Status: DC | PRN

## 2015-09-10 NOTE — Progress Notes (Signed)
Patient ID: Priscilla Molina MRN: [id number], DOB: Jun 30, 1960, 55 y.o. Date of Encounter: 09/10/2015, 12:22 PM    Chief Complaint: Cough    HPI: 55 y.o. year old female    09/03/2015: presents with c/o cough, chest congestion.  Says the symptoms started Thursday 08/30/2015. Says that at that time she was having sore throat and her head was stuffy. Since then the sore throat eased off but she is still stuffy in the head. Says that yesterday she developed significant chest congestion and coughing up green phlegm. Also developed fever yesterday. Has been using her albuterol inhaler.  On Exam at that visit: Oxygen saturation was 97% on room air. She had some wheezing throughout bilaterally but with good air movement.  At that visit she was prescribed: Z-Pak Prednisone 20 mg to take 2 daily 2 days then take 1 daily for 4 days  Today --  09/10/2015: She reports that she did take the azithromycin as directed. Also took the prednisone as directed. Has been using her albuterol. Says that she never really got any better. Says at this point her breathing actually seems even worse. Still with congested cough and still getting up green phlegm.   Home Meds:   Outpatient Medications Prior to Visit  Medication Sig Dispense Refill  . albuterol (PROVENTIL HFA;VENTOLIN HFA) 108 (90 BASE) MCG/ACT inhaler Inhale 2 puffs into the lungs every 6 (six) hours as needed for wheezing or shortness of breath.    Marland Kitchen alendronate (FOSAMAX) 70 MG tablet TAKE 1 TABLET BY MOUTH ONCE A WEEK WITH A FULL GLASS OF WATER ON EMPTY STOMACH 12 tablet 1  . atorvastatin (LIPITOR) 20 MG tablet Take 1 tablet (20 mg total) by mouth daily. 30 tablet 5  . citalopram (CELEXA) 40 MG tablet TAKE 1 TABLET EVERY DAY 90 tablet 1  . clonazePAM (KLONOPIN) 1 MG tablet TAKE 1 TABLET TWICE A DAY AS NEEDED 60 tablet 2  . cloNIDine (CATAPRES) 0.1 MG tablet TAKE 1 TABLET TWICE A DAY 60 tablet 5  . linaclotide (LINZESS) 290 MCG CAPS capsule Take 1  capsule (290 mcg total) by mouth daily before breakfast. 90 capsule 1  . meloxicam (MOBIC) 15 MG tablet Take 15 mg by mouth daily. Reported on 08/08/2015    . omeprazole (PRILOSEC) 20 MG capsule TAKE 1 CAPSULE EVERY DAY 90 capsule 1  . SUMAtriptan (IMITREX) 100 MG tablet TAKE 1 TABLET AS NEEDED FOR MIGRAINE. MAY REPEAT 1 TIME IN 2 HOURS 10 tablet 5  . VESICARE 5 MG tablet TAKE 1 TABLET EVERY DAY 90 tablet 1  . predniSONE (DELTASONE) 20 MG tablet Take 2 daily for 2 days then 1 daily for 4 days 8 tablet 0  . azithromycin (ZITHROMAX) 250 MG tablet Day 1: Take 2 daily. Days 2-5: Take 1 daily. 6 tablet 0   No facility-administered medications prior to visit.     Allergies:  Allergies  Allergen Reactions  . Codeine Rash      Review of Systems: See HPI for pertinent ROS. All other ROS negative.    Physical Exam: Blood pressure 130/76, pulse 92, temperature 98.3 F (36.8 C), temperature source Oral, resp. rate 18, weight 183 lb (83 kg)., Body mass index is 35.74 kg/m. SaO2 on RA-- 93% General:  WNWD WF. Appears in no acute distress. HEENT: Normocephalic, atraumatic, eyes without discharge, sclera non-icteric, nares are without discharge. Bilateral auditory canals clear, TM's are without perforation, pearly grey and translucent with reflective cone of light bilaterally. Oral cavity moist,  posterior pharynx without exudate, erythema, peritonsillar abscess.  Neck: Supple. No thyromegaly. No lymphadenopathy. Lungs:  She has harsh coarse wheezes throughout bilaterally. Repeat Exam After DuoNeb: Much more air flow. Not as harsh, coarse.  Heart: Regular rhythm. No murmurs, rubs, or gallops. Msk:  Strength and tone normal for age. Extremities/Skin: Warm and dry.  Neuro: Alert and oriented X 3. Moves all extremities spontaneously. Gait is normal. CNII-XII grossly in tact. Psych:  Responds to questions appropriately with a normal affect.     ASSESSMENT AND PLAN:  55 y.o. year old female with    1. COPD exacerbation (HCC) In the office, she is given: --DuoNeb --DepoMedrol 80 mg IM At home she is to take: --Levaquin --Albuterol She is to schedule f/u OV with me Wednesday 09/12/2015. F/U sooner if worsens in interim. Discussed indications to go to ER as well.   - ipratropium-albuterol (DUONEB) 0.5-2.5 (3) MG/3ML nebulizer solution 3 mL; Take 3 mLs by nebulization once. - methylPREDNISolone acetate (DEPO-MEDROL) injection 80 mg; Inject 1 mL (80 mg total) into the muscle once. - levofloxacin (LEVAQUIN) 750 MG tablet; Take 1 tablet (750 mg total) by mouth daily.  Dispense: 7 tablet; Refill: 0    Signed, [address] [place], [place], [place] [facility] 09/10/2015 12:22 PM

## 2015-09-12 ENCOUNTER — Ambulatory Visit (INDEPENDENT_AMBULATORY_CARE_PROVIDER_SITE_OTHER): Payer: Medicare Other | Admitting: Physician Assistant

## 2015-09-12 VITALS — BP 118/76 | HR 84 | Temp 98.0°F | Resp 20 | Wt 183.0 lb

## 2015-09-12 DIAGNOSIS — J441 Chronic obstructive pulmonary disease with (acute) exacerbation: Secondary | ICD-10-CM

## 2015-09-12 MED ORDER — FLUTICASONE FUROATE-VILANTEROL 100-25 MCG/INH IN AEPB: 1.0000 | INHALATION_SPRAY | Freq: Every day | RESPIRATORY_TRACT | 5 refills | Status: AC

## 2015-09-12 MED ORDER — UMECLIDINIUM BROMIDE 62.5 MCG/INH IN AEPB: 1.0000 | INHALATION_SPRAY | Freq: Every day | RESPIRATORY_TRACT | 5 refills | Status: AC

## 2015-09-12 MED ORDER — METHYLPREDNISOLONE ACETATE 80 MG/ML IJ SUSP
120.0000 mg | Freq: Once | INTRAMUSCULAR | Status: AC
  Administered 2015-09-12: 120 mg via INTRAMUSCULAR

## 2015-09-12 NOTE — Progress Notes (Signed)
Patient ID: Priscilla Molina MRN: [id number], DOB: 1960-04-27, 55 y.o. Date of Encounter: 09/12/2015, 1:42 PM    Chief Complaint: Cough    HPI: 55 y.o. year old female    09/03/2015: presents with c/o cough, chest congestion.  Says the symptoms started Thursday 08/30/2015. Says that at that time she was having sore throat and her head was stuffy. Since then the sore throat eased off but she is still stuffy in the head. Says that yesterday she developed significant chest congestion and coughing up green phlegm. Also developed fever yesterday. Has been using her albuterol inhaler.  On Exam at that visit: Oxygen saturation was 97% on room air. She had some wheezing throughout bilaterally but with good air movement.  At that visit she was prescribed: Z-Pak Prednisone 20 mg to take 2 daily 2 days then take 1 daily for 4 days      09/10/2015: She reports that she did take the azithromycin as directed. Also took the prednisone as directed. Has been using her albuterol. Says that she never really got any better. Says at this point her breathing actually seems even worse. Still with congested cough and still getting up green phlegm.   At that OV: Gave DuoNeb in the office. Gave Depo-Medrol 80 mg IM Prescribed Levaquin Told her to schedule f/u OV in 2 days   09/12/2015: Today she reports that she has been taking the Levaquin as directed. Says that she has been using the albuterol inhaler 3 times a day. Says her breathing is only slightly improved compared to last visit. Says that she is still having to sleep propped up on about 6 pillows. Says if she tried to lay flat, she would cough repeatedly and would feel like she was smothering. Reviewed her chart and reviewed that I see no history of COPD exacerbations since she has been coming here. She agrees. Says that she has not had any problem like this in a long time.  Says that even remotely she "just had bronchitis--- that was just  treated with the albuterol inhaler."   Has never had any problems with her breathing that required this much" treatment.   Home Meds:   Outpatient Medications Prior to Visit  Medication Sig Dispense Refill  . albuterol (PROVENTIL HFA;VENTOLIN HFA) 108 (90 Base) MCG/ACT inhaler Inhale 2 puffs into the lungs every 6 (six) hours as needed for wheezing or shortness of breath. 18 g 3  . alendronate (FOSAMAX) 70 MG tablet TAKE 1 TABLET BY MOUTH ONCE A WEEK WITH A FULL GLASS OF WATER ON EMPTY STOMACH 12 tablet 1  . atorvastatin (LIPITOR) 20 MG tablet Take 1 tablet (20 mg total) by mouth daily. 30 tablet 5  . citalopram (CELEXA) 40 MG tablet TAKE 1 TABLET EVERY DAY 90 tablet 1  . clonazePAM (KLONOPIN) 1 MG tablet TAKE 1 TABLET TWICE A DAY AS NEEDED 60 tablet 2  . cloNIDine (CATAPRES) 0.1 MG tablet TAKE 1 TABLET TWICE A DAY 60 tablet 5  . levofloxacin (LEVAQUIN) 750 MG tablet Take 1 tablet (750 mg total) by mouth daily. 7 tablet 0  . linaclotide (LINZESS) 290 MCG CAPS capsule Take 1 capsule (290 mcg total) by mouth daily before breakfast. 90 capsule 1  . meloxicam (MOBIC) 15 MG tablet Take 15 mg by mouth daily. Reported on 08/08/2015    . omeprazole (PRILOSEC) 20 MG capsule TAKE 1 CAPSULE EVERY DAY 90 capsule 1  . predniSONE (DELTASONE) 20 MG tablet Take 2 daily for 2  days then 1 daily for 4 days 8 tablet 0  . SUMAtriptan (IMITREX) 100 MG tablet TAKE 1 TABLET AS NEEDED FOR MIGRAINE. MAY REPEAT 1 TIME IN 2 HOURS 10 tablet 5  . VESICARE 5 MG tablet TAKE 1 TABLET EVERY DAY 90 tablet 1   No facility-administered medications prior to visit.     Allergies:  Allergies  Allergen Reactions  . Codeine Rash      Review of Systems: See HPI for pertinent ROS. All other ROS negative.    Physical Exam: Blood pressure 118/76, pulse 84, temperature 98 F (36.7 C), temperature source Oral, resp. rate 20, weight 183 lb (83 kg)., Body mass index is 35.74 kg/m. SaO2 on RA-- 97% General:  WNWD WF. Appears in  no acute distress. HEENT: Normocephalic, atraumatic, eyes without discharge, sclera non-icteric, nares are without discharge. Bilateral auditory canals clear, TM's are without perforation, pearly grey and translucent with reflective cone of light bilaterally. Oral cavity moist, posterior pharynx without exudate, erythema, peritonsillar abscess.  Neck: Supple. No thyromegaly. No lymphadenopathy. Lungs:  She has wheezes throughout, bilaterally.  Heart: Regular rhythm. No murmurs, rubs, or gallops. Msk:  Strength and tone normal for age. Extremities/Skin: Warm and dry.  Neuro: Alert and oriented X 3. Moves all extremities spontaneously. Gait is normal. CNII-XII grossly in tact. Psych:  Responds to questions appropriately with a normal affect.     ASSESSMENT AND PLAN:  55 y.o. year old female with   1. COPD exacerbation (HCC)  - methylPREDNISolone acetate (DEPO-MEDROL) injection 120 mg; Inject 1.5 mLs (120 mg total) into the muscle once.  Discussed option of her going to the hospital for treatment versus [place] giving another injection today and having her return here tomorrow. She states that she definitely does not want to go to the hospital. Is agreeable with injection today and says that she definitely can come back here tomorrow-- that she lives very close by-- and can definitely return for follow-up visit tomorrow. In the office gave Depo-Medrol 120 mg IM. Also added Breo and Incruz--She is to use one puff of each of these daily--every day---- She will schedule follow-up office visit with me here tomorrow. She is to go to the emergency room if breathing worsens in the interim.   [address] [place], [place], [facility] 09/12/2015 1:42 PM

## 2015-09-13 ENCOUNTER — Ambulatory Visit
Admission: RE | Admit: 2015-09-13 | Discharge: 2015-09-13 | Disposition: A | Discharge status: Home / Self Care | Payer: Medicare Other | Source: Ambulatory Visit | Attending: Physician Assistant | Admitting: Physician Assistant

## 2015-09-13 ENCOUNTER — Ambulatory Visit (INDEPENDENT_AMBULATORY_CARE_PROVIDER_SITE_OTHER): Payer: Medicare Other | Admitting: Physician Assistant

## 2015-09-13 ENCOUNTER — Encounter: Payer: Self-pay | Admitting: Physician Assistant

## 2015-09-13 VITALS — BP 134/80 | HR 88 | Temp 98.3°F | Resp 18 | Wt 182.0 lb

## 2015-09-13 DIAGNOSIS — J441 Chronic obstructive pulmonary disease with (acute) exacerbation: Secondary | ICD-10-CM

## 2015-09-13 DIAGNOSIS — R05 Cough: Secondary | ICD-10-CM | POA: Diagnosis not present

## 2015-09-13 DIAGNOSIS — R0602 Shortness of breath: Secondary | ICD-10-CM | POA: Diagnosis not present

## 2015-09-13 MED ORDER — METHYLPREDNISOLONE ACETATE 80 MG/ML IJ SUSP
120.0000 mg | Freq: Once | INTRAMUSCULAR | Status: AC
  Administered 2015-09-13: 120 mg via INTRAMUSCULAR

## 2015-09-13 NOTE — Progress Notes (Signed)
Patient ID: Krystl Wickware MRN: [id number], DOB: Nov 08, 1960, 55 y.o. Date of Encounter: 09/13/2015, 1:14 PM    Chief Complaint: Cough    HPI: 55 y.o. year old female    09/03/2015: presents with c/o cough, chest congestion.  Says the symptoms started Thursday 08/30/2015. Says that at that time she was having sore throat and her head was stuffy. Since then the sore throat eased off but she is still stuffy in the head. Says that yesterday she developed significant chest congestion and coughing up green phlegm. Also developed fever yesterday. Has been using her albuterol inhaler.  On Exam at that visit: Oxygen saturation was 97% on room air. She had some wheezing throughout bilaterally but with good air movement.  At that visit she was prescribed: Z-Pak Prednisone 20 mg to take 2 daily 2 days then take 1 daily for 4 days      09/10/2015: She reports that she did take the azithromycin as directed. Also took the prednisone as directed. Has been using her albuterol. Says that she never really got any better. Says at this point her breathing actually seems even worse. Still with congested cough and still getting up green phlegm.   At that OV: Gave DuoNeb in the office. Gave Depo-Medrol 80 mg IM Prescribed Levaquin Told her to schedule f/u OV in 2 days   09/12/2015: Today she reports that she has been taking the Levaquin as directed. Says that she has been using the albuterol inhaler 3 times a day. Says her breathing is only slightly improved compared to last visit. Says that she is still having to sleep propped up on about 6 pillows. Says if she tried to lay flat, she would cough repeatedly and would feel like she was smothering. Reviewed her chart and reviewed that I see no history of COPD exacerbations since she has been coming here. She agrees. Says that she has not had any problem like this in a long time.  Says that even remotely she "just had bronchitis--- that was just  treated with the albuterol inhaler."   Has never had any problems with her breathing that required this much" treatment. ASSESSMENT/PLAN AT THAT OV: COPD exacerbation (HCC)  - methylPREDNISolone acetate (DEPO-MEDROL) injection 120 mg; Inject 1.5 mLs (120 mg total) into the muscle once.  Discussed option of her going to the hospital for treatment versus [place] giving another injection today and having her return here tomorrow. She states that she definitely does not want to go to the hospital. Is agreeable with injection today and says that she definitely can come back here tomorrow-- that she lives very close by-- and can definitely return for follow-up visit tomorrow. In the office gave Depo-Medrol 120 mg IM. Also added Breo and Incruz--She is to use one puff of each of these daily--every day---- She will schedule follow-up office visit with me here tomorrow. She is to go to the emergency room if breathing worsens in the interim.  09/13/2015: She reports that she feels that her breathing is improved some today.  She went to pick up new inhalers yesterday but pharmacy did not have them in stock--told her they would have them today between 2:00 - 4:00 so she plans to return to pick them up this afternoon.  No additional updates at this time.    Home Meds:   Outpatient Medications Prior to Visit  Medication Sig Dispense Refill  . albuterol (PROVENTIL HFA;VENTOLIN HFA) 108 (90 Base) MCG/ACT inhaler Inhale 2 puffs into  the lungs every 6 (six) hours as needed for wheezing or shortness of breath. 18 g 3  . alendronate (FOSAMAX) 70 MG tablet TAKE 1 TABLET BY MOUTH ONCE A WEEK WITH A FULL GLASS OF WATER ON EMPTY STOMACH 12 tablet 1  . atorvastatin (LIPITOR) 20 MG tablet Take 1 tablet (20 mg total) by mouth daily. 30 tablet 5  . citalopram (CELEXA) 40 MG tablet TAKE 1 TABLET EVERY DAY 90 tablet 1  . clonazePAM (KLONOPIN) 1 MG tablet TAKE 1 TABLET TWICE A DAY AS NEEDED 60 tablet 2  . cloNIDine  (CATAPRES) 0.1 MG tablet TAKE 1 TABLET TWICE A DAY 60 tablet 5  . fluticasone furoate-vilanterol (BREO ELLIPTA) 100-25 MCG/INH AEPB Inhale 1 puff into the lungs daily. 1 each 5  . levofloxacin (LEVAQUIN) 750 MG tablet Take 1 tablet (750 mg total) by mouth daily. 7 tablet 0  . linaclotide (LINZESS) 290 MCG CAPS capsule Take 1 capsule (290 mcg total) by mouth daily before breakfast. 90 capsule 1  . meloxicam (MOBIC) 15 MG tablet Take 15 mg by mouth daily. Reported on 08/08/2015    . omeprazole (PRILOSEC) 20 MG capsule TAKE 1 CAPSULE EVERY DAY 90 capsule 1  . predniSONE (DELTASONE) 20 MG tablet Take 2 daily for 2 days then 1 daily for 4 days 8 tablet 0  . SUMAtriptan (IMITREX) 100 MG tablet TAKE 1 TABLET AS NEEDED FOR MIGRAINE. MAY REPEAT 1 TIME IN 2 HOURS 10 tablet 5  . umeclidinium bromide (INCRUSE ELLIPTA) 62.5 MCG/INH AEPB Inhale 1 puff into the lungs daily. 1 each 5  . VESICARE 5 MG tablet TAKE 1 TABLET EVERY DAY 90 tablet 1   No facility-administered medications prior to visit.     Allergies:  Allergies  Allergen Reactions  . Codeine Rash      Review of Systems: See HPI for pertinent ROS. All other ROS negative.    Physical Exam: Blood pressure 134/80, pulse 88, temperature 98.3 F (36.8 C), temperature source Oral, resp. rate 18, weight 182 lb (82.6 kg), SpO2 97 %., Body mass index is 35.54 kg/m. SaO2 on RA-- 97% General:  WNWD WF. Appears in no acute distress. HEENT: Normocephalic, atraumatic, eyes without discharge, sclera non-icteric, nares are without discharge. Bilateral auditory canals clear, TM's are without perforation, pearly grey and translucent with reflective cone of light bilaterally. Oral cavity moist, posterior pharynx without exudate, erythema, peritonsillar abscess.  Neck: Supple. No thyromegaly. No lymphadenopathy. Lungs:  She has wheezes throughout, bilaterally but does have increased air movement today compared to yesterday.  Heart: Regular rhythm. No murmurs,  rubs, or gallops. Msk:  Strength and tone normal for age. Extremities/Skin: Warm and dry.  Neuro: Alert and oriented X 3. Moves all extremities spontaneously. Gait is normal. CNII-XII grossly in tact. Psych:  Responds to questions appropriately with a normal affect.     ASSESSMENT AND PLAN:  55 y.o. year old female with   1. COPD exacerbation (HCC)  - methylPREDNISolone acetate (DEPO-MEDROL) injection 120 mg; Inject 1.5 mLs (120 mg total) into the muscle once.  Will give another Depo Medrol 120mg  IM.  Will obtain Chest XRay. She is to leave here and go directly to CXR. She will pick up Virgel Bouquet and Incruz at pharmacy this afternoon and add these.  Continue Levaquin. She will schedule f/u OV here for tomorrow--either with Dr. Tanya Nones or Dr. Jeanice Lim, as I am off tomorrow.  Pt voices understanding, agrees to do all of the above.   She is to go  to the emergency room if breathing worsens in the interim.   [address] [place], [place], [facility] 09/13/2015 1:14 PM

## 2015-09-14 ENCOUNTER — Ambulatory Visit (INDEPENDENT_AMBULATORY_CARE_PROVIDER_SITE_OTHER): Payer: Medicare Other | Admitting: Family Medicine

## 2015-09-14 ENCOUNTER — Encounter: Payer: Self-pay | Admitting: Family Medicine

## 2015-09-14 VITALS — BP 132/84 | HR 94 | Temp 98.1°F | Resp 18 | Wt 182.0 lb

## 2015-09-14 DIAGNOSIS — J441 Chronic obstructive pulmonary disease with (acute) exacerbation: Secondary | ICD-10-CM | POA: Diagnosis not present

## 2015-09-14 MED ORDER — PREDNISONE 20 MG PO TABS: ORAL_TABLET | ORAL | 0 refills | Status: DC

## 2015-09-14 NOTE — Progress Notes (Signed)
Subjective:    Patient ID: Priscilla Molina, female    DOB: 06-26-1960, 55 y.o.   MRN: [id number]  HPI I reviewed the most recent office visits for this patient. She's been treated numerous times over the last week for COPD exacerbation. She is currently on Levaquin. She has been receiving IM Depo-Medrol 120 mg last 2 office visits. Finally her breathing has improved. I reviewed her chest x-ray findings with her. There was no evidence of pneumonia. She states that her wheezing is better. She still has significant chest congestion. She denies any hemoptysis or purulent sputum. She denies any fever. She is still using albuterol every 6 hours but this is helping. She is being compliant with Breo and incruse. Past Medical History:  Diagnosis Date  . Anxiety   . Depression   . GERD (gastroesophageal reflux disease)   . Hyperlipidemia   . Hypertension   . Osteoporosis    Past Surgical History:  Procedure Laterality Date  . CHOLECYSTECTOMY     Current Outpatient Prescriptions on File Prior to Visit  Medication Sig Dispense Refill  . albuterol (PROVENTIL HFA;VENTOLIN HFA) 108 (90 Base) MCG/ACT inhaler Inhale 2 puffs into the lungs every 6 (six) hours as needed for wheezing or shortness of breath. 18 g 3  . alendronate (FOSAMAX) 70 MG tablet TAKE 1 TABLET BY MOUTH ONCE A WEEK WITH A FULL GLASS OF WATER ON EMPTY STOMACH 12 tablet 1  . atorvastatin (LIPITOR) 20 MG tablet Take 1 tablet (20 mg total) by mouth daily. 30 tablet 5  . citalopram (CELEXA) 40 MG tablet TAKE 1 TABLET EVERY DAY 90 tablet 1  . clonazePAM (KLONOPIN) 1 MG tablet TAKE 1 TABLET TWICE A DAY AS NEEDED 60 tablet 2  . cloNIDine (CATAPRES) 0.1 MG tablet TAKE 1 TABLET TWICE A DAY 60 tablet 5  . fluticasone furoate-vilanterol (BREO ELLIPTA) 100-25 MCG/INH AEPB Inhale 1 puff into the lungs daily. 1 each 5  . levofloxacin (LEVAQUIN) 750 MG tablet Take 1 tablet (750 mg total) by mouth daily. 7 tablet 0  . linaclotide (LINZESS) 290 MCG CAPS  capsule Take 1 capsule (290 mcg total) by mouth daily before breakfast. 90 capsule 1  . meloxicam (MOBIC) 15 MG tablet Take 15 mg by mouth daily. Reported on 08/08/2015    . omeprazole (PRILOSEC) 20 MG capsule TAKE 1 CAPSULE EVERY DAY 90 capsule 1  . SUMAtriptan (IMITREX) 100 MG tablet TAKE 1 TABLET AS NEEDED FOR MIGRAINE. MAY REPEAT 1 TIME IN 2 HOURS 10 tablet 5  . umeclidinium bromide (INCRUSE ELLIPTA) 62.5 MCG/INH AEPB Inhale 1 puff into the lungs daily. 1 each 5  . VESICARE 5 MG tablet TAKE 1 TABLET EVERY DAY 90 tablet 1  . predniSONE (DELTASONE) 20 MG tablet Take 2 daily for 2 days then 1 daily for 4 days (Patient not taking: Reported on 09/14/2015) 8 tablet 0   No current facility-administered medications on file prior to visit.    Allergies  Allergen Reactions  . Codeine Rash   Social History   Social History  . Marital status: Married    Spouse name: N/A  . Number of children: N/A  . Years of education: N/A   Occupational History  . Not on file.   Social History Main Topics  . Smoking status: Current Every Day Smoker    Packs/day: 0.50    Types: Cigarettes  . Smokeless tobacco: Never Used  . Alcohol use No  . Drug use: No  . Sexual activity: Not  Currently   Other Topics Concern  . Not on file   Social History Narrative  . No narrative on file      Review of Systems  All other systems reviewed and are negative.      Objective:   Physical Exam  Constitutional: She appears well-developed and well-nourished.  Neck: No JVD present.  Cardiovascular: Normal rate, regular rhythm and normal heart sounds.   Pulmonary/Chest: Effort normal. She has wheezes. She has rales.  Musculoskeletal: She exhibits no edema.          Assessment & Plan:  COPD exacerbation (HCC) - Plan: predniSONE (DELTASONE) 20 MG tablet  Clinically patient is doing much better. Prednisone taper pack. Begin 60 mg a day to day and tomorrow 40 mg on Sunday and recheck here on Monday.  Complete Levaquin. Continue albuterol 2 puffs every 6 hours as needed. Strongly encouraged smoking cessation

## 2015-09-17 ENCOUNTER — Ambulatory Visit (INDEPENDENT_AMBULATORY_CARE_PROVIDER_SITE_OTHER): Payer: Medicare Other | Admitting: Family Medicine

## 2015-09-17 ENCOUNTER — Encounter: Payer: Self-pay | Admitting: Family Medicine

## 2015-09-17 VITALS — BP 126/78 | HR 80 | Temp 98.0°F | Resp 18 | Wt 181.0 lb

## 2015-09-17 DIAGNOSIS — J441 Chronic obstructive pulmonary disease with (acute) exacerbation: Secondary | ICD-10-CM | POA: Diagnosis not present

## 2015-09-17 NOTE — Progress Notes (Signed)
Subjective:    Patient ID: Priscilla Molina, female    DOB: [birth year], 55 y.o.   MRN: [id number]  HPI  09/14/15 I reviewed the most recent office visits for this patient. She's been treated numerous times over the last week for COPD exacerbation. She is currently on Levaquin. She has been receiving IM Depo-Medrol 120 mg last 2 office visits. Finally her breathing has improved. I reviewed her chest x-ray findings with her. There was no evidence of pneumonia. She states that her wheezing is better. She still has significant chest congestion. She denies any hemoptysis or purulent sputum. She denies any fever. She is still using albuterol every 6 hours but this is helping. She is being compliant with Breo and incruse.  At that time, my plan was:  Clinically patient is doing much better. Prednisone taper pack. Begin 60 mg a day to day and tomorrow 40 mg on Sunday and recheck here on Monday. Complete Levaquin. Continue albuterol 2 puffs every 6 hours as needed. Strongly encouraged smoking cessation  09/17/15 Patient is here today for follow-up. She is now on 40 mg a day of prednisone. Her breathing has improved dramatically. She is only requiring her rescue inhaler once or twice a day. She is currently wearing a NicoDerm patch and trying earnestly to quit smoking. Past Medical History:  Diagnosis Date  . Anxiety   . Depression   . GERD (gastroesophageal reflux disease)   . Hyperlipidemia   . Hypertension   . Osteoporosis    Past Surgical History:  Procedure Laterality Date  . CHOLECYSTECTOMY     Current Outpatient Prescriptions on File Prior to Visit  Medication Sig Dispense Refill  . albuterol (PROVENTIL HFA;VENTOLIN HFA) 108 (90 Base) MCG/ACT inhaler Inhale 2 puffs into the lungs every 6 (six) hours as needed for wheezing or shortness of breath. 18 g 3  . alendronate (FOSAMAX) 70 MG tablet TAKE 1 TABLET BY MOUTH ONCE A WEEK WITH A FULL GLASS OF WATER ON EMPTY STOMACH 12 tablet 1  .  atorvastatin (LIPITOR) 20 MG tablet Take 1 tablet (20 mg total) by mouth daily. 30 tablet 5  . citalopram (CELEXA) 40 MG tablet TAKE 1 TABLET EVERY DAY 90 tablet 1  . clonazePAM (KLONOPIN) 1 MG tablet TAKE 1 TABLET TWICE A DAY AS NEEDED 60 tablet 2  . cloNIDine (CATAPRES) 0.1 MG tablet TAKE 1 TABLET TWICE A DAY 60 tablet 5  . fluticasone furoate-vilanterol (BREO ELLIPTA) 100-25 MCG/INH AEPB Inhale 1 puff into the lungs daily. 1 each 5  . linaclotide (LINZESS) 290 MCG CAPS capsule Take 1 capsule (290 mcg total) by mouth daily before breakfast. 90 capsule 1  . meloxicam (MOBIC) 15 MG tablet Take 15 mg by mouth daily. Reported on 08/08/2015    . omeprazole (PRILOSEC) 20 MG capsule TAKE 1 CAPSULE EVERY DAY 90 capsule 1  . SUMAtriptan (IMITREX) 100 MG tablet TAKE 1 TABLET AS NEEDED FOR MIGRAINE. MAY REPEAT 1 TIME IN 2 HOURS 10 tablet 5  . umeclidinium bromide (INCRUSE ELLIPTA) 62.5 MCG/INH AEPB Inhale 1 puff into the lungs daily. 1 each 5  . VESICARE 5 MG tablet TAKE 1 TABLET EVERY DAY 90 tablet 1   No current facility-administered medications on file prior to visit.    Allergies  Allergen Reactions  . Codeine Rash   Social History   Social History  . Marital status: Married    Spouse name: N/A  . Number of children: N/A  . Years of education: N/A  Occupational History  . Not on file.   Social History Main Topics  . Smoking status: Current Every Day Smoker    Packs/day: 0.50    Types: Cigarettes  . Smokeless tobacco: Never Used  . Alcohol use No  . Drug use: No  . Sexual activity: Not Currently   Other Topics Concern  . Not on file   Social History Narrative  . No narrative on file      Review of Systems  All other systems reviewed and are negative.      Objective:   Physical Exam  Constitutional: She appears well-developed and well-nourished.  Neck: No JVD present.  Cardiovascular: Normal rate, regular rhythm and normal heart sounds.   Pulmonary/Chest: Effort  normal. She has wheezes. She has rales.  Musculoskeletal: She exhibits no edema.          Assessment & Plan:  COPD exacerbation (HCC) Complete prednisone taper. Use albuterol 2 puffs inhaled every 6 hours as needed. I encouraged the patient and congratulated her on her attempt to quit smoking. Continue Breo and incruse.  If successful, I would DC incruse in 1-2 months.

## 2015-10-01 ENCOUNTER — Other Ambulatory Visit: Payer: Self-pay | Admitting: Physician Assistant

## 2015-10-01 NOTE — Telephone Encounter (Signed)
Rx called in 

## 2015-10-01 NOTE — Telephone Encounter (Signed)
LRF 06/25/15 #[id number] + 2   LOV 09/17/15  OK refill?

## 2015-10-01 NOTE — Telephone Encounter (Signed)
Approved # [id number] 

## 2015-10-25 ENCOUNTER — Other Ambulatory Visit: Payer: Self-pay | Admitting: Physician Assistant

## 2015-11-23 ENCOUNTER — Other Ambulatory Visit: Payer: Self-pay | Admitting: Physician Assistant

## 2015-11-23 NOTE — Telephone Encounter (Signed)
RX refilled per protocol 

## 2015-12-17 ENCOUNTER — Encounter: Payer: Self-pay | Admitting: Physician Assistant

## 2015-12-17 ENCOUNTER — Ambulatory Visit (INDEPENDENT_AMBULATORY_CARE_PROVIDER_SITE_OTHER): Payer: Medicare Other | Admitting: Physician Assistant

## 2015-12-17 VITALS — BP 124/82 | HR 83 | Temp 98.2°F | Resp 18 | Ht 60.0 in | Wt 189.0 lb

## 2015-12-17 DIAGNOSIS — R32 Unspecified urinary incontinence: Secondary | ICD-10-CM | POA: Diagnosis not present

## 2015-12-17 DIAGNOSIS — F329 Major depressive disorder, single episode, unspecified: Secondary | ICD-10-CM | POA: Diagnosis not present

## 2015-12-17 DIAGNOSIS — K5909 Other constipation: Secondary | ICD-10-CM | POA: Diagnosis not present

## 2015-12-17 DIAGNOSIS — E785 Hyperlipidemia, unspecified: Secondary | ICD-10-CM

## 2015-12-17 DIAGNOSIS — F4310 Post-traumatic stress disorder, unspecified: Secondary | ICD-10-CM

## 2015-12-17 DIAGNOSIS — Z23 Encounter for immunization: Secondary | ICD-10-CM

## 2015-12-17 DIAGNOSIS — I10 Essential (primary) hypertension: Secondary | ICD-10-CM

## 2015-12-17 DIAGNOSIS — F32A Depression, unspecified: Secondary | ICD-10-CM

## 2015-12-17 DIAGNOSIS — F411 Generalized anxiety disorder: Secondary | ICD-10-CM | POA: Diagnosis not present

## 2015-12-17 DIAGNOSIS — F17200 Nicotine dependence, unspecified, uncomplicated: Secondary | ICD-10-CM | POA: Diagnosis not present

## 2015-12-17 MED ORDER — PHENTERMINE HCL 37.5 MG PO TABS: 37.5000 mg | ORAL_TABLET | Freq: Every day | ORAL | 0 refills | Status: AC
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
## 2015-12-17 NOTE — Addendum Note (Signed)
Addended by: Phineas SemenJOHNSON, Ajanay Farve A on: 12/17/2015 10:33 AM   Modules accepted: Orders

## 2015-12-24 ENCOUNTER — Other Ambulatory Visit: Payer: Medicare Other

## 2015-12-24 DIAGNOSIS — I10 Essential (primary) hypertension: Secondary | ICD-10-CM | POA: Diagnosis not present

## 2015-12-24 DIAGNOSIS — E785 Hyperlipidemia, unspecified: Secondary | ICD-10-CM

## 2015-12-24 LAB — LIPID PANEL
CHOL/HDL RATIO: 5.8 ratio — AB (ref <5.0)
Cholesterol: 167 mg/dL (ref <200)
HDL: 29 mg/dL — AB (ref >50)
LDL CALC: 99 mg/dL
TRIGLYCERIDES: 194 mg/dL — AB (ref <150)
VLDL: 39 mg/dL — AB (ref <30)

## 2015-12-24 LAB — COMPLETE METABOLIC PANEL WITH GFR
ALT: 26 U/L (ref 6–29)
AST: 22 U/L (ref 10–35)
Albumin: 4 g/dL (ref 3.6–5.1)
Alkaline Phosphatase: 121 U/L (ref 33–130)
BUN: 13 mg/dL (ref 7–25)
CHLORIDE: 108 mmol/L (ref 98–110)
CO2: 23 mmol/L (ref 20–31)
Calcium: 8.8 mg/dL (ref 8.6–10.4)
Creat: 0.51 mg/dL (ref 0.50–1.05)
GLUCOSE: 94 mg/dL (ref 70–99)
POTASSIUM: 4.2 mmol/L (ref 3.5–5.3)
SODIUM: 142 mmol/L (ref 135–146)
Total Bilirubin: 0.6 mg/dL (ref 0.2–1.2)
Total Protein: 6 g/dL — ABNORMAL LOW (ref 6.1–8.1)

## 2016-01-03 ENCOUNTER — Other Ambulatory Visit: Payer: Self-pay | Admitting: Cardiovascular Disease

## 2016-01-04 NOTE — Telephone Encounter (Signed)
Review for refill. 

## 2016-01-04 NOTE — Telephone Encounter (Signed)
Rx(s) sent to pharmacy electronically.  

## 2016-01-07 ENCOUNTER — Other Ambulatory Visit: Payer: Self-pay | Admitting: Physician Assistant

## 2016-01-07 NOTE — Telephone Encounter (Signed)
Last Rx for Klonopin was 8/14 for #60+2 Approved. #60+2.

## 2016-01-07 NOTE — Telephone Encounter (Signed)
Rx filled Klonopin called in

## 2016-01-07 NOTE — Telephone Encounter (Signed)
Last OV 10-30 Last refill 8-14 Okay to refill clonazepam?

## 2016-02-07 ENCOUNTER — Other Ambulatory Visit: Payer: Self-pay | Admitting: Physician Assistant

## 2016-02-07 DIAGNOSIS — K5909 Other constipation: Secondary | ICD-10-CM

## 2016-02-08 ENCOUNTER — Other Ambulatory Visit: Payer: Self-pay

## 2016-02-08 DIAGNOSIS — K5909 Other constipation: Secondary | ICD-10-CM

## 2016-04-03 ENCOUNTER — Other Ambulatory Visit: Payer: Self-pay | Admitting: Physician Assistant

## 2016-04-03 NOTE — Telephone Encounter (Signed)
Rx filled per protocol  

## 2016-04-12 ENCOUNTER — Other Ambulatory Visit: Payer: Self-pay | Admitting: Physician Assistant

## 2016-04-14 NOTE — Telephone Encounter (Signed)
Ok to refill 

## 2016-04-14 NOTE — Telephone Encounter (Signed)
Rx called in 

## 2016-04-14 NOTE — Telephone Encounter (Signed)
Approved # [id number] 

## 2016-04-22 ENCOUNTER — Other Ambulatory Visit: Payer: Self-pay | Admitting: Physician Assistant

## 2016-06-09 ENCOUNTER — Encounter: Payer: Self-pay | Admitting: Physician Assistant

## 2016-06-09 ENCOUNTER — Ambulatory Visit (INDEPENDENT_AMBULATORY_CARE_PROVIDER_SITE_OTHER): Payer: Medicare Other | Admitting: Physician Assistant

## 2016-06-09 VITALS — BP 124/84 | HR 90 | Temp 98.2°F | Resp 16 | Wt 178.8 lb

## 2016-06-09 DIAGNOSIS — F411 Generalized anxiety disorder: Secondary | ICD-10-CM | POA: Diagnosis not present

## 2016-06-09 DIAGNOSIS — E785 Hyperlipidemia, unspecified: Secondary | ICD-10-CM | POA: Diagnosis not present

## 2016-06-09 DIAGNOSIS — K5909 Other constipation: Secondary | ICD-10-CM

## 2016-06-09 DIAGNOSIS — I10 Essential (primary) hypertension: Secondary | ICD-10-CM

## 2016-06-09 LAB — COMPLETE METABOLIC PANEL WITH GFR
ALBUMIN: 4.1 g/dL (ref 3.6–5.1)
ALK PHOS: 143 U/L — AB (ref 33–130)
ALT: 27 U/L (ref 6–29)
AST: 19 U/L (ref 10–35)
BUN: 14 mg/dL (ref 7–25)
CALCIUM: 9.3 mg/dL (ref 8.6–10.4)
CO2: 17 mmol/L — ABNORMAL LOW (ref 20–31)
CREATININE: 0.69 mg/dL (ref 0.50–1.05)
Chloride: 108 mmol/L (ref 98–110)
GFR, Est African American: >89 mL/min (ref >=60)
GFR, Est Non African American: >89 mL/min (ref >=60)
Glucose, Bld: 114 mg/dL — ABNORMAL HIGH (ref 70–99)
POTASSIUM: 4.3 mmol/L (ref 3.5–5.3)
Sodium: 140 mmol/L (ref 135–146)
Total Bilirubin: 0.7 mg/dL (ref 0.2–1.2)
Total Protein: 6.8 g/dL (ref 6.1–8.1)
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
## 2016-06-16 ENCOUNTER — Ambulatory Visit: Payer: Medicare Other | Admitting: Physician Assistant

## 2016-07-10 ENCOUNTER — Ambulatory Visit (INDEPENDENT_AMBULATORY_CARE_PROVIDER_SITE_OTHER): Payer: Medicare Other | Admitting: Physician Assistant

## 2016-07-10 ENCOUNTER — Encounter: Payer: Self-pay | Admitting: Physician Assistant

## 2016-07-10 VITALS — BP 118/82 | HR 84 | Temp 97.6°F | Resp 16 | Wt 176.8 lb

## 2016-07-10 DIAGNOSIS — G43009 Migraine without aura, not intractable, without status migrainosus: Secondary | ICD-10-CM

## 2016-07-10 DIAGNOSIS — F411 Generalized anxiety disorder: Secondary | ICD-10-CM

## 2016-07-10 DIAGNOSIS — E785 Hyperlipidemia, unspecified: Secondary | ICD-10-CM

## 2016-07-10 DIAGNOSIS — Z0000 Encounter for general adult medical examination without abnormal findings: Secondary | ICD-10-CM | POA: Diagnosis not present

## 2016-07-10 DIAGNOSIS — K5909 Other constipation: Secondary | ICD-10-CM

## 2016-07-10 DIAGNOSIS — R32 Unspecified urinary incontinence: Secondary | ICD-10-CM

## 2016-07-10 DIAGNOSIS — Z23 Encounter for immunization: Secondary | ICD-10-CM | POA: Diagnosis not present

## 2016-07-10 DIAGNOSIS — I10 Essential (primary) hypertension: Secondary | ICD-10-CM | POA: Diagnosis not present

## 2016-07-10 DIAGNOSIS — F17200 Nicotine dependence, unspecified, uncomplicated: Secondary | ICD-10-CM | POA: Diagnosis not present

## 2016-07-10 DIAGNOSIS — F4310 Post-traumatic stress disorder, unspecified: Secondary | ICD-10-CM

## 2016-07-10 LAB — COMPLETE METABOLIC PANEL WITH GFR
ALBUMIN: 4 g/dL (ref 3.6–5.1)
ALT: 37 U/L — AB (ref 6–29)
AST: 27 U/L (ref 10–35)
Alkaline Phosphatase: 145 U/L — ABNORMAL HIGH (ref 33–130)
BUN: 10 mg/dL (ref 7–25)
CALCIUM: 9.2 mg/dL (ref 8.6–10.4)
CHLORIDE: 107 mmol/L (ref 98–110)
CO2: 23 mmol/L (ref 20–31)
CREATININE: 0.65 mg/dL (ref 0.50–1.05)
GFR, Est African American: >89 mL/min (ref >=60)
GFR, Est Non African American: >89 mL/min (ref >=60)
GLUCOSE: 100 mg/dL — AB (ref 70–99)
POTASSIUM: 4.4 mmol/L (ref 3.5–5.3)
SODIUM: 140 mmol/L (ref 135–146)
Total Bilirubin: 0.7 mg/dL (ref 0.2–1.2)
Total Protein: 6.4 g/dL (ref 6.1–8.1)

## 2016-07-10 LAB — TSH: TSH: 2.13 m[IU]/L

## 2016-07-10 LAB — LIPID PANEL
CHOL/HDL RATIO: 6.8 ratio — AB (ref <5.0)
Cholesterol: 204 mg/dL — ABNORMAL HIGH (ref <200)
HDL: 30 mg/dL — ABNORMAL LOW (ref >50)
LDL CALC: 133 mg/dL — AB (ref <100)
Triglycerides: 206 mg/dL — ABNORMAL HIGH (ref <150)
VLDL: 41 mg/dL — AB (ref <30)

## 2016-07-10 LAB — CBC WITH DIFFERENTIAL/PLATELET
BASOS PCT: 0 %
Basophils Absolute: 0 cells/uL (ref 0–200)
EOS PCT: 3 %
Eosinophils Absolute: 282 cells/uL (ref 15–500)
HEMATOCRIT: 44.2 % (ref 35.0–45.0)
HEMOGLOBIN: 14.3 g/dL (ref 12.0–15.0)
LYMPHS ABS: 2632 {cells}/uL (ref 850–3900)
Lymphocytes Relative: 28 %
MCH: 26.7 pg — ABNORMAL LOW (ref 27.0–33.0)
MCHC: 32.4 g/dL (ref 32.0–36.0)
MCV: 82.5 fL (ref 80.0–100.0)
MONO ABS: 564 {cells}/uL (ref 200–950)
MPV: 10.9 fL (ref 7.5–12.5)
Monocytes Relative: 6 %
NEUTROS PCT: 63 %
Neutro Abs: 5922 cells/uL (ref 1500–7800)
Platelets: 252 10*3/uL (ref 140–400)
RBC: 5.36 MIL/uL — AB (ref 3.80–5.10)
RDW: 15.5 % — AB (ref 11.0–15.0)
WBC: 9.4 10*3/uL (ref 3.8–10.8)

## 2016-07-10 NOTE — Addendum Note (Signed)
Addended by: Phineas SemenJOHNSON, Mavis Gravelle A on: 07/10/2016 10:07 AM   Modules accepted: Orders

## 2016-07-10 NOTE — Progress Notes (Signed)
Patient ID: Priscilla Molina MRN: [id number], DOB: September 25, 1960, 56 y.o. Date of Encounter: 07/10/2016,   Chief Complaint: Physical (CPE)  HPI: 56 y.o. y/o female  here for CPE.   She has no complaints or concerns to address today. She is here for physical to update preventive care.  Review of Systems: Consitutional: No fever, chills, fatigue, night sweats, lymphadenopathy. No significant/unexplained weight changes. Eyes: No visual changes, eye redness, or discharge. ENT/Mouth: No ear pain, sore throat, nasal drainage, or sinus pain. Cardiovascular: No chest pressure,heaviness, tightness or squeezing, even with exertion. No increased shortness of breath or dyspnea on exertion.No palpitations, edema, orthopnea, PND. Respiratory: No hemoptysis. Gastrointestinal: No anorexia, pain, nausea, vomiting, hematemesis, diarrhea,  BRBPR, or melena. Breast: No mass, nodules, bulging, or retraction. No skin changes or inflammation. No nipple discharge. No lymphadenopathy. Genitourinary: No dysuria, hematuria,  vaginal discharge, pruritis, burning, abnormal bleeding, or pain. Musculoskeletal: No decreased ROM, No joint pain or swelling.  Skin: No rash, pruritis, or concerning lesions. Neurological: No dizziness, syncope, seizures, tremors, memory loss, coordination problems, or paresthesias. Psychological: No  hallucinations, SI/HI. Endocrine: No polydipsia, polyphagia, polyuria, or known diabetes.No increased fatigue. No palpitations/rapid heart rate. No significant/unexplained weight change. All other systems were reviewed and are otherwise negative.  Past Medical History:  Diagnosis Date  . Anxiety   . Depression   . GERD (gastroesophageal reflux disease)   . Hyperlipidemia   . Hypertension   . Osteoporosis      Past Surgical History:  Procedure Laterality Date  . CHOLECYSTECTOMY      Home Meds:  Outpatient Medications Prior to Visit  Medication Sig Dispense Refill  . albuterol  (PROVENTIL HFA;VENTOLIN HFA) 108 (90 Base) MCG/ACT inhaler Inhale 2 puffs into the lungs every 6 (six) hours as needed for wheezing or shortness of breath. 18 g 3  . atorvastatin (LIPITOR) 20 MG tablet Take 1 tablet (20 mg total) by mouth daily. 30 tablet 6  . citalopram (CELEXA) 40 MG tablet TAKE 1 TABLET EVERY DAY 90 tablet 1  . clonazePAM (KLONOPIN) 1 MG tablet TAKE 1 TABLET BY MOUTH TWICE A DAY AS NEEDED 60 tablet 2  . cloNIDine (CATAPRES) 0.1 MG tablet TAKE 1 TABLET TWICE A DAY 60 tablet 5  . fluticasone furoate-vilanterol (BREO ELLIPTA) 100-25 MCG/INH AEPB Inhale 1 puff into the lungs daily. 1 each 5  . LINZESS 290 MCG CAPS capsule TAKE ONE CAPSULE BY MOUTH EVERY DAY BEFORE BREAKFAST 30 capsule 5  . omeprazole (PRILOSEC) 20 MG capsule TAKE 1 CAPSULE EVERY DAY 90 capsule 1  . phentermine (ADIPEX-P) 37.5 MG tablet Take 1 tablet (37.5 mg total) by mouth daily before breakfast. 30 tablet 0  . SUMAtriptan (IMITREX) 100 MG tablet TAKE 1 TABLET AS NEEDED FOR MIGRAINE. MAY REPEAT 1 TIME IN 2 HOURS 10 tablet 5  . umeclidinium bromide (INCRUSE ELLIPTA) 62.5 MCG/INH AEPB Inhale 1 puff into the lungs daily. 1 each 5  . VESICARE 5 MG tablet TAKE 1 TABLET EVERY DAY 90 tablet 1   No facility-administered medications prior to visit.     Allergies:  Allergies  Allergen Reactions  . Codeine Rash    Social History   Social History  . Marital status: Married    Spouse name: N/A  . Number of children: N/A  . Years of education: N/A   Occupational History  . Not on file.   Social History Main Topics  . Smoking status: Former Smoker    Packs/day: 0.50    Types:  Cigarettes    Quit date: 06/02/2016  . Smokeless tobacco: Never Used  . Alcohol use No  . Drug use: No  . Sexual activity: Not Currently   Other Topics Concern  . Not on file   Social History Narrative  . No narrative on file    Family History  Problem Relation Age of Onset  . Adopted: Yes  . Heart disease Mother   .  Hypertension Mother   . Stroke Sister   . Hypertension Sister   . Stroke Brother   . Diabetes Sister   . Hyperlipidemia Sister     Physical Exam: Blood pressure 118/82, pulse 84, temperature 97.6 F (36.4 C), temperature source Oral, resp. rate 16, weight 176 lb 12.8 oz (80.2 kg), SpO2 97 %., Body mass index is 34.53 kg/m. General: Well developed, well nourished WF. Appears in no acute distress. HEENT: Normocephalic, atraumatic. Conjunctiva pink, sclera non-icteric. Pupils 2 mm constricting to 1 mm, round, regular, and equally reactive to light and accomodation. EOMI. Internal auditory canal clear. TMs with good cone of light and without pathology. Nasal mucosa pink. Nares are without discharge. No sinus tenderness. Oral mucosa pink.  Pharynx without exudate.   Neck: Supple. Trachea midline. No thyromegaly. Full ROM. No lymphadenopathy.No Carotid Bruits. Lungs: Clear to auscultation bilaterally without wheezes, rales, or rhonchi. Breathing is of normal effort and unlabored. Cardiovascular: RRR with S1 S2. No murmurs, rubs, or gallops. Distal pulses 2+ symmetrically. No carotid or abdominal bruits. Breast: She refuses breast exam. Says she does self breast exams and has palpated no masses. Abdomen: Soft, non-tender, non-distended with normoactive bowel sounds. No hepatosplenomegaly or masses. No rebound/guarding. No CVA tenderness. No hernias.  Genitourinary: She refuses Pelvic Exam Musculoskeletal: Full range of motion and 5/5 strength throughout. Without swelling, atrophy, tenderness, crepitus, or warmth. Extremities without clubbing, cyanosis, or edema. Skin: Warm and moist without erythema, ecchymosis, wounds, or rash. Neuro: A+Ox3. CN II-XII grossly intact. Moves all extremities spontaneously. Full sensation throughout. Normal gait.  Psych:  Responds to questions appropriately with a normal affect.   Assessment/Plan:  56 y.o. y/o female here for CPE  1. Encounter for preventive health  examination  A. Screening Labs: She is fasting. We'll check screening labs now. - CBC with Differential/Platelet - COMPLETE METABOLIC PANEL WITH GFR - Lipid panel - TSH  B. Pap: She refuses pelvic exam and Pap smear today. States that she has no history of abnormal Pap smear or other pathology in her pelvic region and is having no symptoms and refuses to have pelvic exam. She has history of anxiety and PTSD. Discussed the benefits of doing pelvic exam and Pap smear but she still refuses.  C. Screening Mammogram: She refuses for me to do breast exam today. She has history of anxiety and PTSD. She also states that she does self breast exam and has felt no masses. Initially when I discussed mammogram she did not want to have another mammogram. Says the last one was performed about 4 years ago in [place]. However once I further discussed the test and the benefits of having it done she is agreeable for me to place order for this. - MM Digital Screening; Future  D. DEXA/BMD:  She has history of osteoporosis. She states that she took the once weekly prescription medicine for about 8 years and then was told that she had completed that therapy and so the medicine was discontinued.  E. Colorectal Cancer Screening: She states that she has had one colonoscopy and  this was performed in 04/08/2013 right before she moved here it [place] and showed 1 polyp and she was told to repeat 5 years.  F. Immunizations:  Influenza: -----She has been getting flu vaccines. N/A today--It is May Tetanus:------ She states that she has had no tetanus vaccine in greater than 10 years. Agreeable to update today. T dap given here 07/10/2016 Pneumococcal:--She is a smoker. Has not had a pneumonia vaccine. Agreeable to get Pneumovax 23 today. Then will wait to give further pneumonia vaccine what she is age 89.  ----------------------Pneumovax 23 given here 07/10/2016 Zostavax:--------- she received Zostavax  01/17/2014. Will not give Shingrix in addition to that at this time----   2. Essential hypertension Blood pressure is at goal/well controlled.  3. Hyperlipidemia, unspecified hyperlipidemia type She is on Lipitor. She is fasting. We'll check lab to monitor. - COMPLETE METABOLIC PANEL WITH GFR - Lipid panel  4. Migraine without aura and without status migrainosus, not intractable This is stable and controlled on current medications.  5. Chronic constipation This is stable and controlled on current medications.   6. Generalized anxiety disorder This is stable and controlled on current medications.   7. Post traumatic stress disorder (PTSD) This is stable and controlled on current medications.   8. Urinary incontinence, unspecified type This is stable and controlled on current medications.   9. Smoker She is trying to quit smoking but giving her smoking history Will give Pneumovax 23 today.  Murray HodgkinsSigned, Danashia Landers Beth EdmondDixon, [place]PA, [facility] 07/10/2016 8:46 AM

## 2016-07-15 ENCOUNTER — Other Ambulatory Visit: Payer: Self-pay

## 2016-07-15 NOTE — Telephone Encounter (Signed)
Patient is out of town and is requesting a refill  Last Ov 07/10/2016 Last refill 04/14/2016 Ok to refill?

## 2016-07-16 ENCOUNTER — Other Ambulatory Visit: Payer: Self-pay | Admitting: *Deleted

## 2016-07-16 ENCOUNTER — Other Ambulatory Visit: Payer: Self-pay | Admitting: Physician Assistant

## 2016-07-16 DIAGNOSIS — Z1239 Encounter for other screening for malignant neoplasm of breast: Secondary | ICD-10-CM

## 2016-07-16 MED ORDER — SUMATRIPTAN SUCCINATE 100 MG PO TABS: ORAL_TABLET | ORAL | 5 refills | Status: DC

## 2016-07-16 MED ORDER — CLONAZEPAM 1 MG PO TABS: 1.0000 mg | ORAL_TABLET | Freq: Two times a day (BID) | ORAL | 2 refills | Status: DC | PRN

## 2016-07-16 NOTE — Telephone Encounter (Signed)
Approved # [id number] 

## 2016-07-16 NOTE — Telephone Encounter (Signed)
Rx called in pt aware 

## 2016-07-17 ENCOUNTER — Other Ambulatory Visit: Payer: Self-pay | Admitting: Physician Assistant

## 2016-07-17 DIAGNOSIS — Z1231 Encounter for screening mammogram for malignant neoplasm of breast: Secondary | ICD-10-CM

## 2016-07-20 ENCOUNTER — Other Ambulatory Visit: Payer: Self-pay | Admitting: Cardiovascular Disease

## 2016-07-21 NOTE — Telephone Encounter (Signed)
Please review for refill, Thanks !  

## 2016-07-21 NOTE — Telephone Encounter (Signed)
Rx has been sent to the pharmacy electronically. ° °

## 2016-07-21 NOTE — Telephone Encounter (Incomplete)
Please review for refill, Thanks !  

## 2016-07-27 ENCOUNTER — Other Ambulatory Visit: Payer: Self-pay | Admitting: Physician Assistant

## 2016-07-28 NOTE — Telephone Encounter (Signed)
Refill appropriate 

## 2016-08-07 ENCOUNTER — Ambulatory Visit: Payer: Medicare Other

## 2016-08-18 ENCOUNTER — Ambulatory Visit: Payer: Medicare Other

## 2016-08-19 ENCOUNTER — Other Ambulatory Visit: Payer: Self-pay | Admitting: Physician Assistant

## 2016-08-19 DIAGNOSIS — K5909 Other constipation: Secondary | ICD-10-CM

## 2016-09-03 ENCOUNTER — Other Ambulatory Visit: Payer: Self-pay

## 2016-09-03 MED ORDER — ALBUTEROL SULFATE HFA 108 (90 BASE) MCG/ACT IN AERS: 2.0000 | INHALATION_SPRAY | Freq: Four times a day (QID) | RESPIRATORY_TRACT | 3 refills | Status: DC | PRN

## 2016-09-03 MED ORDER — SOLIFENACIN SUCCINATE 5 MG PO TABS: 5.0000 mg | ORAL_TABLET | Freq: Every day | ORAL | 1 refills | Status: DC

## 2016-09-09 ENCOUNTER — Other Ambulatory Visit: Payer: Self-pay | Admitting: *Deleted

## 2016-09-09 MED ORDER — SOLIFENACIN SUCCINATE 5 MG PO TABS: 5.0000 mg | ORAL_TABLET | Freq: Every day | ORAL | 1 refills | Status: AC

## 2016-09-09 MED ORDER — ALBUTEROL SULFATE HFA 108 (90 BASE) MCG/ACT IN AERS: 2.0000 | INHALATION_SPRAY | Freq: Four times a day (QID) | RESPIRATORY_TRACT | 3 refills | Status: AC | PRN

## 2016-09-15 ENCOUNTER — Ambulatory Visit: Payer: Medicare Other

## 2016-09-19 ENCOUNTER — Other Ambulatory Visit: Payer: Self-pay | Admitting: Physician Assistant

## 2016-09-19 DIAGNOSIS — K5909 Other constipation: Secondary | ICD-10-CM

## 2016-09-19 NOTE — Telephone Encounter (Signed)
Refill appropriate 

## 2016-10-04 ENCOUNTER — Other Ambulatory Visit: Payer: Self-pay | Admitting: Physician Assistant

## 2016-10-06 ENCOUNTER — Telehealth: Payer: Self-pay | Admitting: Physician Assistant

## 2016-10-06 ENCOUNTER — Other Ambulatory Visit: Payer: Self-pay | Admitting: Physician Assistant

## 2016-10-06 ENCOUNTER — Other Ambulatory Visit: Payer: Self-pay | Admitting: Cardiovascular Disease

## 2016-10-06 NOTE — Telephone Encounter (Signed)
Approved # [id number] 

## 2016-10-06 NOTE — Telephone Encounter (Signed)
Last OV 5/24 Last refill 07/16/2016 Ok to refill?

## 2016-10-06 NOTE — Telephone Encounter (Signed)
Refill appropriate 

## 2016-10-06 NOTE — Telephone Encounter (Signed)
Refill Request.  

## 2016-10-06 NOTE — Telephone Encounter (Signed)
Patient states she no longer sees heart doctor and would like for you to start filling cholesterol meds. Is that ok to send in refill?

## 2016-10-06 NOTE — Telephone Encounter (Signed)
rx called into pharmacy

## 2016-10-06 NOTE — Telephone Encounter (Signed)
New Message  Pt voiced calling to see about her cholesterol medication and waiting to hear back from the nurse.  Please f/u

## 2016-10-06 NOTE — Telephone Encounter (Signed)
Please review for refill, thanks ! 

## 2016-10-06 NOTE — Telephone Encounter (Signed)
Yes that is okay. I did labs to monitor this on 07/10/16. Therefore can send refills to last until 01/10/17--- at that time will be due for fasting labs and office visit.

## 2016-10-07 MED ORDER — ATORVASTATIN CALCIUM 20 MG PO TABS: 20.0000 mg | ORAL_TABLET | Freq: Every day | ORAL | 4 refills | Status: AC

## 2016-10-07 NOTE — Telephone Encounter (Signed)
Patient aware that PCP will fill cholesterol medication

## 2016-10-24 ENCOUNTER — Other Ambulatory Visit: Payer: Self-pay | Admitting: Physician Assistant

## 2016-10-26 ENCOUNTER — Other Ambulatory Visit: Payer: Self-pay | Admitting: Physician Assistant

## 2016-10-26 DIAGNOSIS — K5909 Other constipation: Secondary | ICD-10-CM

## 2016-12-11 DIAGNOSIS — G43109 Migraine with aura, not intractable, without status migrainosus: Secondary | ICD-10-CM | POA: Diagnosis not present

## 2016-12-11 DIAGNOSIS — Z6832 Body mass index (BMI) 32.0-32.9, adult: Secondary | ICD-10-CM | POA: Diagnosis not present

## 2016-12-11 DIAGNOSIS — F413 Other mixed anxiety disorders: Secondary | ICD-10-CM | POA: Diagnosis not present

## 2016-12-11 DIAGNOSIS — N39498 Other specified urinary incontinence: Secondary | ICD-10-CM | POA: Diagnosis not present

## 2016-12-11 DIAGNOSIS — J440 Chronic obstructive pulmonary disease with acute lower respiratory infection: Secondary | ICD-10-CM | POA: Diagnosis not present

## 2016-12-11 DIAGNOSIS — F4312 Post-traumatic stress disorder, chronic: Secondary | ICD-10-CM | POA: Diagnosis not present

## 2016-12-11 DIAGNOSIS — K581 Irritable bowel syndrome with constipation: Secondary | ICD-10-CM | POA: Diagnosis not present

## 2016-12-11 DIAGNOSIS — R140 Abdominal distension (gaseous): Secondary | ICD-10-CM | POA: Diagnosis not present

## 2016-12-11 DIAGNOSIS — J309 Allergic rhinitis, unspecified: Secondary | ICD-10-CM | POA: Diagnosis not present

## 2016-12-11 DIAGNOSIS — R110 Nausea: Secondary | ICD-10-CM | POA: Diagnosis not present

## 2016-12-29 DIAGNOSIS — K581 Irritable bowel syndrome with constipation: Secondary | ICD-10-CM | POA: Diagnosis not present

## 2016-12-29 DIAGNOSIS — Z1389 Encounter for screening for other disorder: Secondary | ICD-10-CM | POA: Diagnosis not present

## 2016-12-29 DIAGNOSIS — M545 Low back pain: Secondary | ICD-10-CM | POA: Diagnosis not present

## 2016-12-29 DIAGNOSIS — J440 Chronic obstructive pulmonary disease with acute lower respiratory infection: Secondary | ICD-10-CM | POA: Diagnosis not present

## 2016-12-29 DIAGNOSIS — F413 Other mixed anxiety disorders: Secondary | ICD-10-CM | POA: Diagnosis not present

## 2016-12-29 DIAGNOSIS — G43109 Migraine with aura, not intractable, without status migrainosus: Secondary | ICD-10-CM | POA: Diagnosis not present

## 2016-12-29 DIAGNOSIS — Z6832 Body mass index (BMI) 32.0-32.9, adult: Secondary | ICD-10-CM | POA: Diagnosis not present

## 2017-01-07 ENCOUNTER — Other Ambulatory Visit: Payer: Self-pay | Admitting: Physician Assistant

## 2017-01-07 NOTE — Telephone Encounter (Signed)
Last OV 07/10/2016 Last refill 10/06/2016 Ok to refill?

## 2017-01-07 NOTE — Telephone Encounter (Signed)
ok 

## 2017-01-12 ENCOUNTER — Ambulatory Visit: Payer: Medicare Other | Admitting: Physician Assistant

## 2017-01-12 ENCOUNTER — Other Ambulatory Visit: Payer: Self-pay | Admitting: Physician Assistant

## 2017-01-12 NOTE — Telephone Encounter (Signed)
Refill appropriate 

## 2017-01-12 NOTE — Telephone Encounter (Signed)
rx called into pharmacy

## 2017-01-15 DIAGNOSIS — Z23 Encounter for immunization: Secondary | ICD-10-CM | POA: Diagnosis not present

## 2017-01-19 ENCOUNTER — Ambulatory Visit: Payer: Medicare Other | Admitting: Physician Assistant

## 2017-01-27 DIAGNOSIS — M545 Low back pain: Secondary | ICD-10-CM | POA: Diagnosis not present

## 2017-01-27 DIAGNOSIS — G43109 Migraine with aura, not intractable, without status migrainosus: Secondary | ICD-10-CM | POA: Diagnosis not present

## 2017-01-27 DIAGNOSIS — R140 Abdominal distension (gaseous): Secondary | ICD-10-CM | POA: Diagnosis not present

## 2017-01-27 DIAGNOSIS — F413 Other mixed anxiety disorders: Secondary | ICD-10-CM | POA: Diagnosis not present

## 2017-01-27 DIAGNOSIS — Z1211 Encounter for screening for malignant neoplasm of colon: Secondary | ICD-10-CM | POA: Diagnosis not present

## 2017-01-27 DIAGNOSIS — Z1231 Encounter for screening mammogram for malignant neoplasm of breast: Secondary | ICD-10-CM | POA: Diagnosis not present

## 2017-01-27 DIAGNOSIS — F4312 Post-traumatic stress disorder, chronic: Secondary | ICD-10-CM | POA: Diagnosis not present

## 2017-01-27 DIAGNOSIS — Z6832 Body mass index (BMI) 32.0-32.9, adult: Secondary | ICD-10-CM | POA: Diagnosis not present

## 2017-01-27 DIAGNOSIS — K581 Irritable bowel syndrome with constipation: Secondary | ICD-10-CM | POA: Diagnosis not present

## 2017-02-11 DIAGNOSIS — Z1231 Encounter for screening mammogram for malignant neoplasm of breast: Secondary | ICD-10-CM | POA: Diagnosis not present

## 2017-02-25 DIAGNOSIS — R922 Inconclusive mammogram: Secondary | ICD-10-CM | POA: Diagnosis not present

## 2017-02-25 DIAGNOSIS — R921 Mammographic calcification found on diagnostic imaging of breast: Secondary | ICD-10-CM | POA: Diagnosis not present

## 2017-03-03 DIAGNOSIS — Z1231 Encounter for screening mammogram for malignant neoplasm of breast: Secondary | ICD-10-CM | POA: Diagnosis not present

## 2017-03-03 DIAGNOSIS — R05 Cough: Secondary | ICD-10-CM | POA: Diagnosis not present

## 2017-03-03 DIAGNOSIS — J449 Chronic obstructive pulmonary disease, unspecified: Secondary | ICD-10-CM | POA: Diagnosis not present

## 2017-03-03 DIAGNOSIS — Z6833 Body mass index (BMI) 33.0-33.9, adult: Secondary | ICD-10-CM | POA: Diagnosis not present

## 2017-03-03 DIAGNOSIS — M810 Age-related osteoporosis without current pathological fracture: Secondary | ICD-10-CM | POA: Diagnosis not present

## 2017-03-03 DIAGNOSIS — R350 Frequency of micturition: Secondary | ICD-10-CM | POA: Diagnosis not present

## 2017-03-03 DIAGNOSIS — J0100 Acute maxillary sinusitis, unspecified: Secondary | ICD-10-CM | POA: Diagnosis not present

## 2017-03-03 DIAGNOSIS — K219 Gastro-esophageal reflux disease without esophagitis: Secondary | ICD-10-CM | POA: Diagnosis not present

## 2017-03-03 DIAGNOSIS — I10 Essential (primary) hypertension: Secondary | ICD-10-CM | POA: Diagnosis not present

## 2017-04-14 IMAGING — NM NM MISC PROCEDURE
6 series · 36 of 36 positions shown · non-contrast
Comparison: none

[Series 1: wbr rest · 6.40mm/px · 6 of 61 frames shown]
[frame 6/61  full-range]
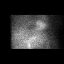
[frame 16/61  full-range]
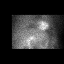
[frame 26/61  full-range]
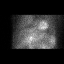
[frame 36/61  full-range]
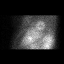
[frame 46/61  full-range]
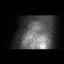
[frame 56/61  full-range]
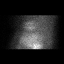

[Series 1: wbr_r-proj_st wbr rest · 6.40mm/px · 6 of 64 frames shown]
[frame 6/64]
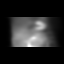
[frame 16/64]
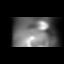
[frame 27/64]
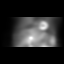
[frame 38/64]
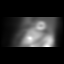
[frame 48/64]
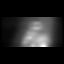
[frame 59/64]
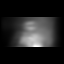

[Series 2: wbr stress-gsp · 6.40mm/px · 6 of 508 frames shown]
[frame 43/508  full-range]
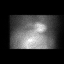
[frame 127/508  full-range]
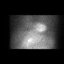
[frame 212/508  full-range]
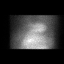
[frame 297/508  full-range]
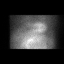
[frame 381/508  full-range]
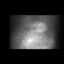
[frame 466/508  full-range]
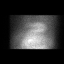

[Series 2: wbr_s-proj_st wbr stress-gsp · 6.40mm/px · 6 of 512 frames shown]
[frame 43/512]
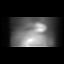
[frame 128/512]
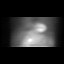
[frame 214/512]
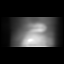
[frame 299/512]
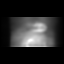
[frame 384/512]
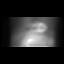
[frame 470/512]
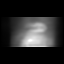

[Series 3: wbr_s-proj_st wbr stress-sum-em · 6.40mm/px · 6 of 64 frames shown]
[frame 6/64]
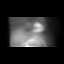
[frame 16/64]
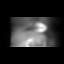
[frame 27/64]
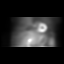
[frame 38/64]
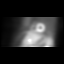
[frame 48/64]
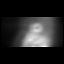
[frame 59/64]
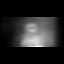

[Series 3: wbr stress-sum-em · 6.40mm/px · 6 of 64 frames shown]
[frame 6/64]
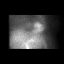
[frame 16/64]
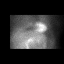
[frame 27/64]
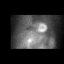
[frame 38/64]
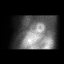
[frame 48/64]
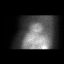
[frame 59/64]
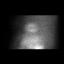

[36 of 36 positions shown; findings below may reference images not displayed]

Canned report from images found in remote index.

Refer to host system for actual result text.

## 2017-06-11 IMAGING — CR DG CHEST 2V
2 series · 2 of 2 positions shown · non-contrast
Comparison: None in PACs

CLINICAL DATA: Cough, wheezing, and shortness of breath; history of
smoking

EXAM:
CHEST  2 VIEW

[w chest pa]
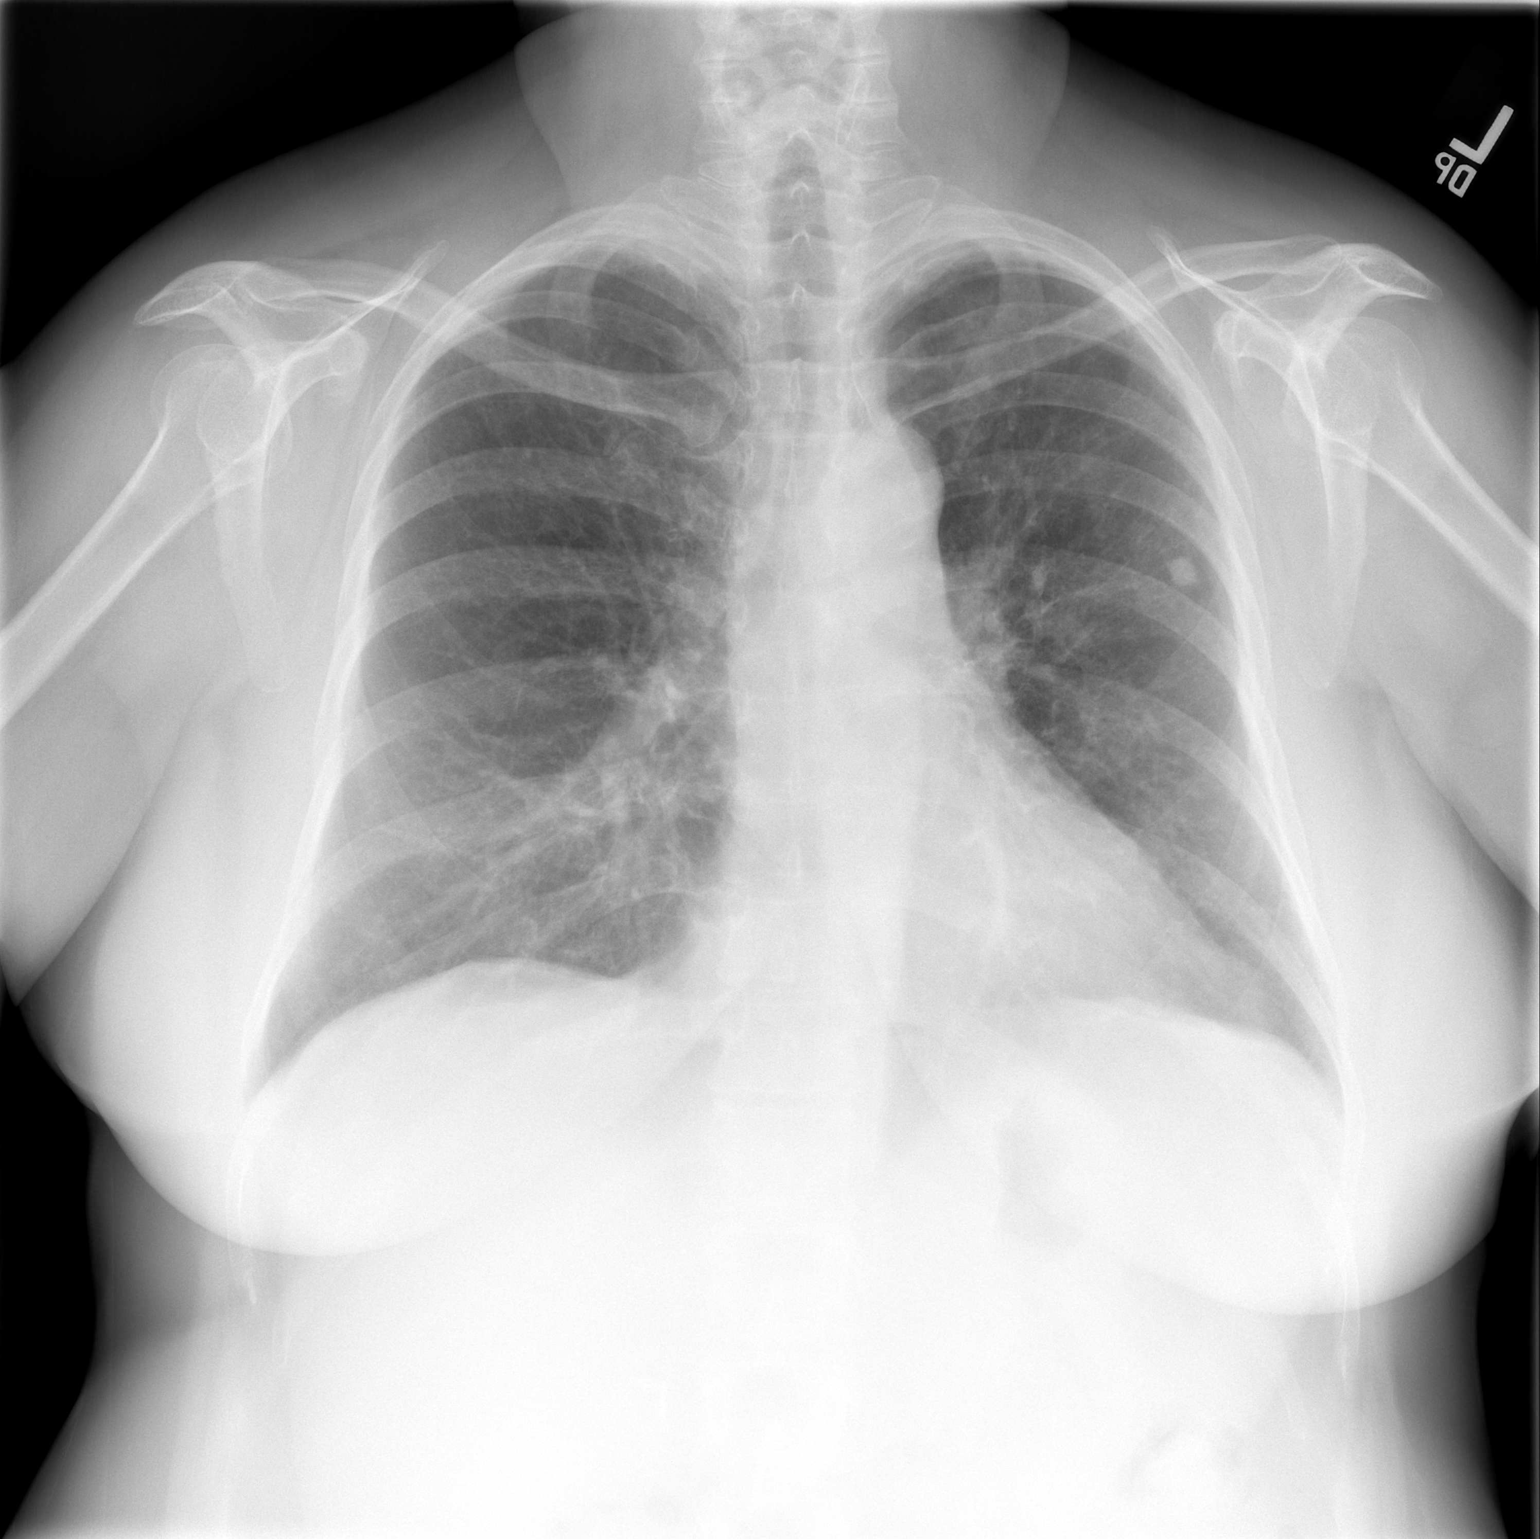

[w chest lat]
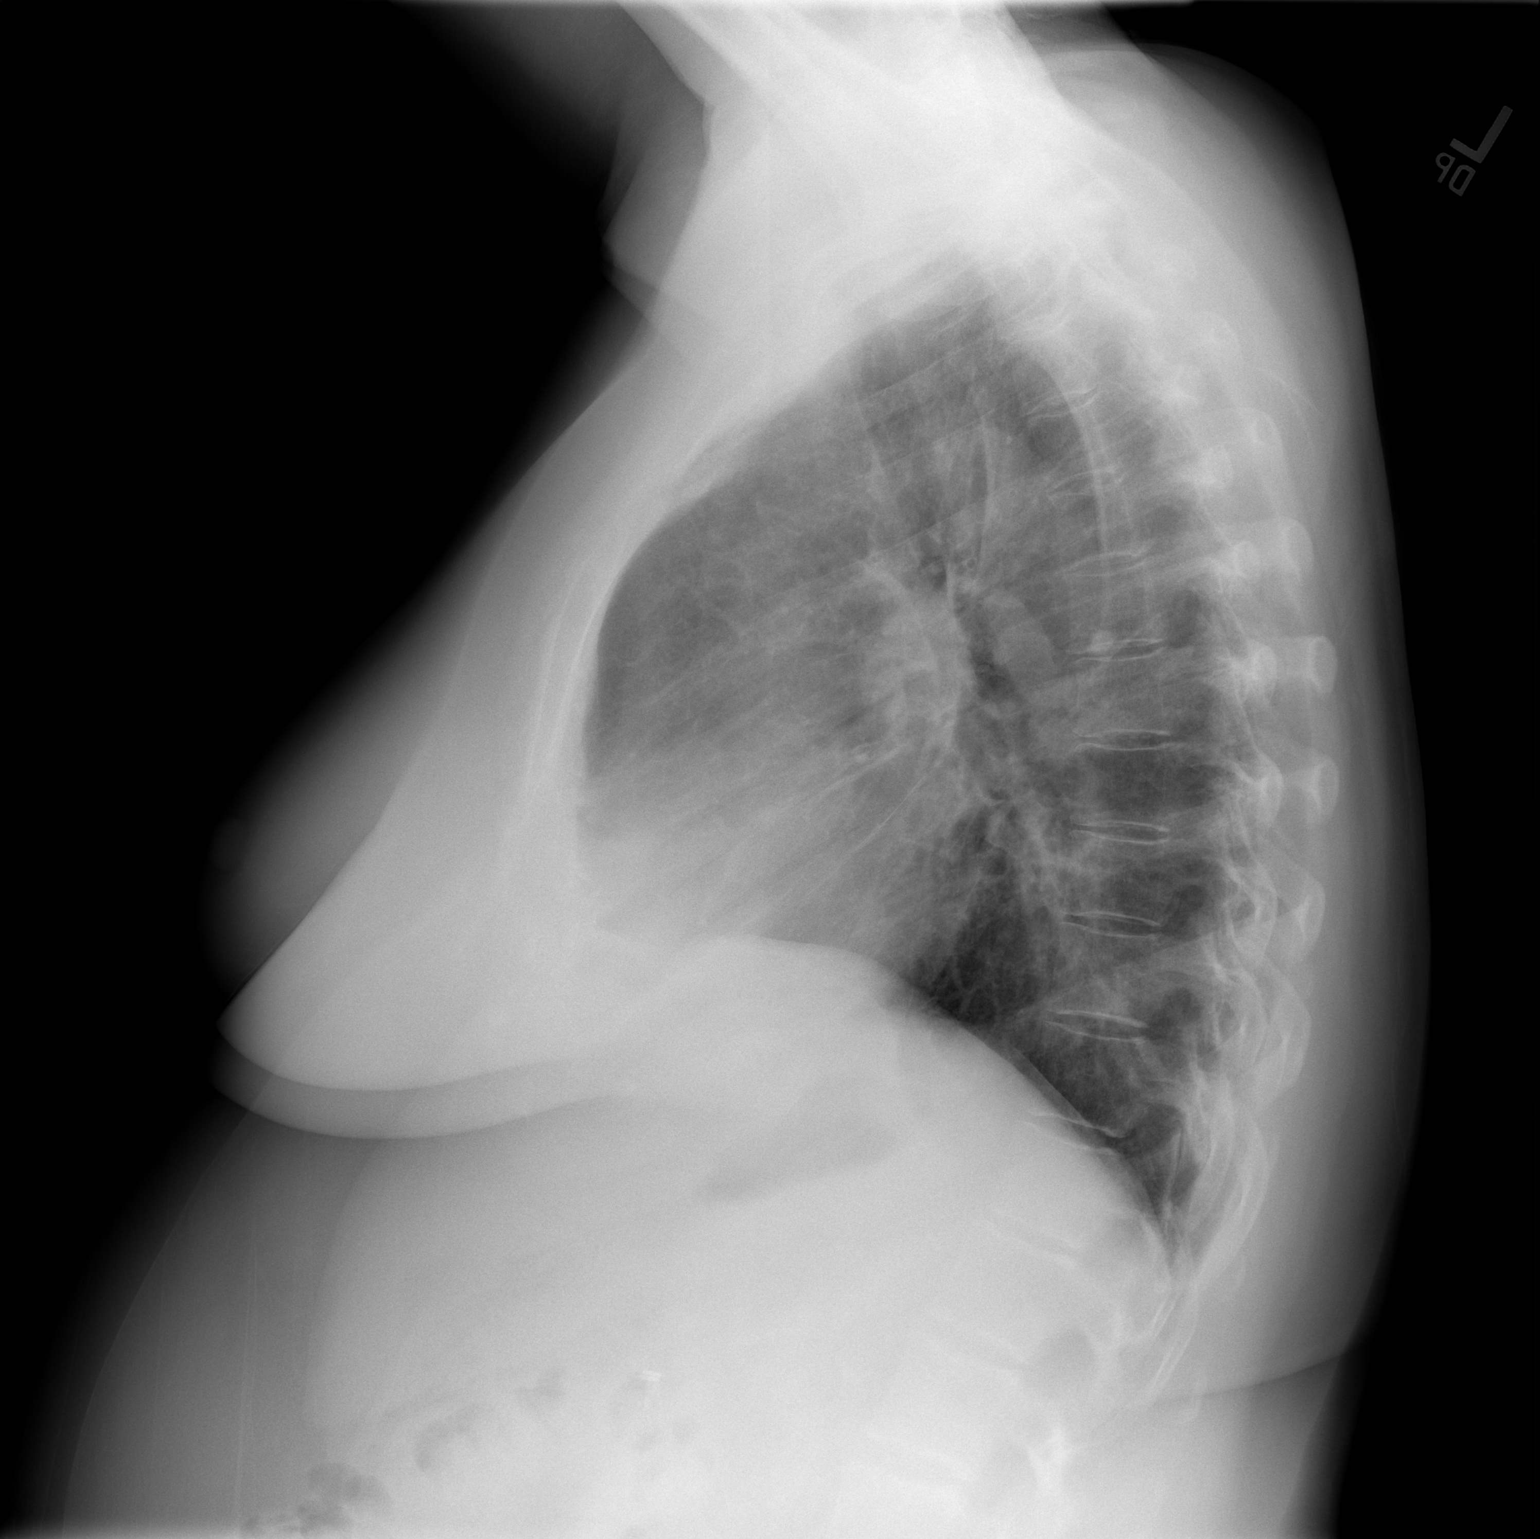

[2 of 2 positions shown; findings below may reference images not displayed]

FINDINGS: The lungs are adequately inflated. There is no focal infiltrate. The
interstitial markings are mildly prominent bilaterally. There is an
approximately 7 x 8 mm calcified nodule in the posterior inferior
aspect of the left upper lobe. No other calcified nodules are
observed. The heart and pulmonary vascularity are normal. The
mediastinum is normal in width. There is calcification in the wall
of the aortic arch. The bony thorax exhibits no acute abnormality.
IMPRESSION: Probable chronic bronchitic -smoking related changes of the
pulmonary interstitium. No alveolar pneumonia nor CHF. Evidence of
previous granulomatous infection.

Aortic atherosclerosis.
